# Patient Record
Sex: Male | Born: 1970 | Race: Black or African American | Hispanic: No | Marital: Single | Smoking: Current some day smoker
Health system: Southern US, Community
[De-identification: ages and names within clinical notes are randomized; demographics above are authoritative.]

## PROBLEM LIST (undated history)

## (undated) ENCOUNTER — Ambulatory Visit (HOSPITAL_COMMUNITY): Source: Home / Self Care

## (undated) HISTORY — PX: CERVICAL FUSION: SHX112

---

## 2001-08-22 ENCOUNTER — Encounter: Payer: Self-pay | Admitting: Emergency Medicine

## 2001-08-22 ENCOUNTER — Emergency Department (HOSPITAL_COMMUNITY): Admission: EM | Admit: 2001-08-22 | Discharge: 2001-08-22 | Payer: Self-pay | Admitting: Emergency Medicine

## 2005-02-10 ENCOUNTER — Emergency Department (HOSPITAL_COMMUNITY): Admission: EM | Admit: 2005-02-10 | Discharge: 2005-02-10 | Payer: Self-pay | Admitting: Emergency Medicine

## 2006-05-22 ENCOUNTER — Emergency Department (HOSPITAL_COMMUNITY): Admission: EM | Admit: 2006-05-22 | Discharge: 2006-05-23 | Payer: Self-pay | Admitting: Emergency Medicine

## 2007-09-24 ENCOUNTER — Emergency Department (HOSPITAL_COMMUNITY): Admission: EM | Admit: 2007-09-24 | Discharge: 2007-09-24 | Payer: Self-pay | Admitting: Emergency Medicine

## 2007-11-04 ENCOUNTER — Emergency Department (HOSPITAL_COMMUNITY): Admission: EM | Admit: 2007-11-04 | Discharge: 2007-11-04 | Payer: Self-pay | Admitting: Emergency Medicine

## 2014-03-25 ENCOUNTER — Encounter (HOSPITAL_COMMUNITY): Payer: Self-pay | Admitting: *Deleted

## 2014-03-25 ENCOUNTER — Emergency Department (HOSPITAL_COMMUNITY)
Admission: EM | Admit: 2014-03-25 | Discharge: 2014-03-25 | Disposition: A | Discharge status: Home / Self Care | Payer: Self-pay | Attending: Emergency Medicine | Admitting: Emergency Medicine

## 2014-03-25 DIAGNOSIS — K529 Noninfective gastroenteritis and colitis, unspecified: Secondary | ICD-10-CM | POA: Insufficient documentation

## 2014-03-25 DIAGNOSIS — Z720 Tobacco use: Secondary | ICD-10-CM | POA: Insufficient documentation

## 2014-03-25 MED ORDER — DIPHENOXYLATE-ATROPINE 2.5-0.025 MG PO TABS: 1.0000 | ORAL_TABLET | Freq: Four times a day (QID) | ORAL | Status: DC | PRN

## 2014-03-25 MED ORDER — MORPHINE SULFATE 4 MG/ML IJ SOLN
4.0000 mg | Freq: Once | INTRAMUSCULAR | Status: AC
  Administered 2014-03-25: 4 mg via INTRAVENOUS
  Filled 2014-03-25: qty 1

## 2014-03-25 MED ORDER — SODIUM CHLORIDE 0.9 % IV BOLUS (SEPSIS)
1000.0000 mL | Freq: Once | INTRAVENOUS | Status: AC
  Administered 2014-03-25: 1000 mL via INTRAVENOUS

## 2014-03-25 MED ORDER — ONDANSETRON HCL 4 MG/2ML IJ SOLN
4.0000 mg | Freq: Once | INTRAMUSCULAR | Status: AC
  Administered 2014-03-25: 4 mg via INTRAVENOUS
  Filled 2014-03-25: qty 2

## 2014-03-25 MED ORDER — PROMETHAZINE HCL 25 MG PO TABS: 25.0000 mg | ORAL_TABLET | Freq: Four times a day (QID) | ORAL | Status: DC | PRN

## 2014-03-25 NOTE — ED Notes (Signed)
Pt arrives from home via POV. Pt reports he began having constant nausea with v/d when he woke up for work this morning. Pt reports he has had multiple episodes of both vomiting and diarrhea this morning. Pt denies pain, but reports he feels a little light headed.

## 2014-03-25 NOTE — ED Provider Notes (Signed)
CSN: [id number]     Arrival date & time 03/25/14  1015 History   First MD Initiated Contact with Patient 03/25/14 1033     Chief Complaint  Patient presents with  . Vomiting     (Consider location/radiation/quality/duration/timing/severity/associated sxs/prior Treatment) HPI..... Multiple episodes of vomiting and diarrhea this morning. No blood in emesis or stool. Also complains of slight headache. He is normally healthy. He has not been exposed to any pathogens. Severity is mild to moderate. Nothing makes symptoms better or worse. No fever, sweats, chills, abdominal pain  History reviewed. No pertinent past medical history. Past Surgical History  Procedure Laterality Date  . Cervical fusion     No family history on file. History  Substance Use Topics  . Smoking status: Current Every Day Smoker  . Smokeless tobacco: Never Used  . Alcohol Use: Yes     Comment: 40 oz every day    Review of Systems  All other systems reviewed and are negative.     Allergies  Review of patient's allergies indicates no known allergies.  Home Medications   Prior to Admission medications   Medication Sig Start Date End Date Taking? Authorizing Provider  diphenoxylate-atropine (LOMOTIL) 2.5-0.025 MG per tablet Take 1 tablet by mouth 4 (four) times daily as needed for diarrhea or loose stools. 03/25/14   Donnetta HutchingBrian Synethia Endicott, MD  promethazine (PHENERGAN) 25 MG tablet Take 1 tablet (25 mg total) by mouth every 6 (six) hours as needed for nausea. 03/25/14   Donnetta HutchingBrian Alanda Colton, MD   BP 126/68 mmHg  Pulse 63  Temp(Src) 98 F (36.7 C) (Oral)  Resp 16  Ht 5\' 11"  (1.803 m)  Wt 175 lb (79.379 kg)  BMI 24.42 kg/m2  SpO2 98% Physical Exam  Constitutional: He is oriented to person, place, and time. He appears well-developed and well-nourished.  HENT:  Head: Normocephalic and atraumatic.  Eyes: Conjunctivae and EOM are normal. Pupils are equal, round, and reactive to light.  Neck: Normal range of motion. Neck supple.   Cardiovascular: Normal rate and regular rhythm.   Pulmonary/Chest: Effort normal and breath sounds normal.  Abdominal: Soft. Bowel sounds are normal.  Musculoskeletal: Normal range of motion.  Neurological: He is alert and oriented to person, place, and time.  Skin: Skin is warm and dry.  Psychiatric: He has a normal mood and affect. His behavior is normal.  Nursing note and vitals reviewed.   ED Course  Procedures (including critical care time) Labs Review Labs Reviewed - No data to display  Imaging Review No results found.   EKG Interpretation None      MDM   Final diagnoses:  Gastroenteritis    No acute abdomen. Patient feels better after 2 L of IV fluids, IV Zofran, IV morphine. Discharge medications Phenergan 25 mg and Lomotil. No clinical evidence of appendicitis at discharge    Donnetta HutchingBrian Tiarrah Saville, MD 03/25/14 1432

## 2014-03-25 NOTE — Discharge Instructions (Signed)
Increase fluids. Medication for nausea and diarrhea. Rest.

## 2015-12-14 DIAGNOSIS — R0602 Shortness of breath: Secondary | ICD-10-CM | POA: Insufficient documentation

## 2015-12-14 DIAGNOSIS — Z5321 Procedure and treatment not carried out due to patient leaving prior to being seen by health care provider: Secondary | ICD-10-CM | POA: Insufficient documentation

## 2015-12-14 DIAGNOSIS — F17200 Nicotine dependence, unspecified, uncomplicated: Secondary | ICD-10-CM | POA: Insufficient documentation

## 2015-12-14 NOTE — ED Triage Notes (Signed)
Patient presents with c/o feeling SOB.  No acute distress noted

## 2015-12-15 ENCOUNTER — Emergency Department (HOSPITAL_COMMUNITY)
Admission: EM | Admit: 2015-12-15 | Discharge: 2015-12-15 | Disposition: A | Discharge status: Home / Self Care | Payer: Self-pay | Attending: Emergency Medicine | Admitting: Emergency Medicine

## 2015-12-15 ENCOUNTER — Encounter (HOSPITAL_COMMUNITY): Payer: Self-pay | Admitting: *Deleted

## 2015-12-15 ENCOUNTER — Emergency Department (HOSPITAL_COMMUNITY): Payer: Self-pay

## 2015-12-15 LAB — CBC WITH DIFFERENTIAL/PLATELET
BASOS PCT: 0 %
Basophils Absolute: 0 10*3/uL (ref 0.0–0.1)
Eosinophils Absolute: 0.2 10*3/uL (ref 0.0–0.7)
Eosinophils Relative: 3 %
HEMATOCRIT: 44.7 % (ref 39.0–52.0)
HEMOGLOBIN: 15.7 g/dL (ref 13.0–17.0)
LYMPHS ABS: 2.9 10*3/uL (ref 0.7–4.0)
LYMPHS PCT: 48 %
MCH: 32.2 pg (ref 26.0–34.0)
MCHC: 35.1 g/dL (ref 30.0–36.0)
MCV: 91.6 fL (ref 78.0–100.0)
MONO ABS: 0.5 10*3/uL (ref 0.1–1.0)
MONOS PCT: 8 %
NEUTROS ABS: 2.5 10*3/uL (ref 1.7–7.7)
NEUTROS PCT: 41 %
Platelets: 281 10*3/uL (ref 150–400)
RBC: 4.88 MIL/uL (ref 4.22–5.81)
RDW: 12.6 % (ref 11.5–15.5)
WBC: 6.1 10*3/uL (ref 4.0–10.5)

## 2015-12-15 LAB — COMPREHENSIVE METABOLIC PANEL
ALBUMIN: 4.3 g/dL (ref 3.5–5.0)
ALK PHOS: 66 U/L (ref 38–126)
ALT: 18 U/L (ref 17–63)
ANION GAP: 7 (ref 5–15)
AST: 27 U/L (ref 15–41)
BILIRUBIN TOTAL: 1.9 mg/dL — AB (ref 0.3–1.2)
BUN: 8 mg/dL (ref 6–20)
CALCIUM: 9.6 mg/dL (ref 8.9–10.3)
CO2: 29 mmol/L (ref 22–32)
Chloride: 105 mmol/L (ref 101–111)
Creatinine, Ser: 0.99 mg/dL (ref 0.61–1.24)
GFR calc Af Amer: >60 mL/min (ref >60)
GLUCOSE: 111 mg/dL — AB (ref 65–99)
POTASSIUM: 3.4 mmol/L — AB (ref 3.5–5.1)
Sodium: 141 mmol/L (ref 135–145)
TOTAL PROTEIN: 7.6 g/dL (ref 6.5–8.1)

## 2015-12-15 LAB — I-STAT TROPONIN, ED: TROPONIN I, POC: 0 ng/mL (ref 0.00–0.08)

## 2015-12-15 NOTE — ED Notes (Signed)
Called Pt for reassessment, no response. Pt is not seen in the lobby

## 2015-12-15 NOTE — ED Notes (Signed)
Pt called for a room. No response. Pt is not seen in the lobby

## 2016-05-23 ENCOUNTER — Encounter (HOSPITAL_COMMUNITY): Payer: Self-pay | Admitting: Emergency Medicine

## 2016-05-23 ENCOUNTER — Emergency Department (HOSPITAL_COMMUNITY)
Admission: EM | Admit: 2016-05-23 | Discharge: 2016-05-23 | Disposition: A | Discharge status: Home / Self Care | Payer: Self-pay | Attending: Emergency Medicine | Admitting: Emergency Medicine

## 2016-05-23 DIAGNOSIS — F17200 Nicotine dependence, unspecified, uncomplicated: Secondary | ICD-10-CM | POA: Insufficient documentation

## 2016-05-23 DIAGNOSIS — Y829 Unspecified medical devices associated with adverse incidents: Secondary | ICD-10-CM | POA: Insufficient documentation

## 2016-05-23 DIAGNOSIS — K0889 Other specified disorders of teeth and supporting structures: Secondary | ICD-10-CM | POA: Insufficient documentation

## 2016-05-23 DIAGNOSIS — T886XXA Anaphylactic reaction due to adverse effect of correct drug or medicament properly administered, initial encounter: Secondary | ICD-10-CM | POA: Insufficient documentation

## 2016-05-23 DIAGNOSIS — T887XXA Unspecified adverse effect of drug or medicament, initial encounter: Secondary | ICD-10-CM | POA: Insufficient documentation

## 2016-05-23 DIAGNOSIS — Z79899 Other long term (current) drug therapy: Secondary | ICD-10-CM | POA: Insufficient documentation

## 2016-05-23 MED ORDER — PENICILLIN V POTASSIUM 500 MG PO TABS: 500.0000 mg | ORAL_TABLET | Freq: Four times a day (QID) | ORAL | 0 refills | Status: DC

## 2016-05-23 MED ORDER — NAPROXEN 250 MG PO TABS: 250.0000 mg | ORAL_TABLET | Freq: Two times a day (BID) | ORAL | 0 refills | Status: DC

## 2016-05-23 NOTE — ED Triage Notes (Signed)
Pt presents to ED after taking his first dose of penicillin tonight for a tooth infection.  Patient states he took it with a sandwich and then began to vomit shortly after.  Patient states he vomited approximately 5 times in 1 hour.  Patient now c/o sore throat, shortness of breath, and "a drowning feeling".  Patient's airway patent.  SpO2 100% on RA.

## 2016-05-23 NOTE — ED Triage Notes (Signed)
Pt c/o tooth pain in upper L side of jaw, no swelling noted, no fevers. Pt does not see a dentist.

## 2016-05-23 NOTE — ED Provider Notes (Signed)
[facility]EMERGENCY DEPT Provider Note   CSN: [id number] Arrival date & time: 05/23/16  0720     History   Chief Complaint Chief Complaint  Patient presents with  . Dental Pain    HPI Ralph Daugherty is a 46 y.o. male.  Ralph Daugherty is a 46 y.o. Male who presents to the emergency department complaining of left lower dental pain for the past 2 days. Patient reports his dental pain began 2 days ago. He reports a cracked tooth to this area. He has taken nothing for treatment of his symptoms. He does not have a dentist. He denies fevers, sore throat, trouble swallowing, facial swelling, or rashes.   The history is provided by the patient and medical records. No language interpreter was used.  Dental Pain      History reviewed. No pertinent past medical history.  There are no active problems to display for this patient.   Past Surgical History:  Procedure Laterality Date  . CERVICAL FUSION         Home Medications    Prior to Admission medications   Medication Sig Start Date End Date Taking? Authorizing Provider  diphenoxylate-atropine (LOMOTIL) 2.5-0.025 MG per tablet Take 1 tablet by mouth 4 (four) times daily as needed for diarrhea or loose stools. 03/25/14   Donnetta Hutching, MD  naproxen (NAPROSYN) 250 MG tablet Take 1 tablet (250 mg total) by mouth 2 (two) times daily with a meal. 05/23/16   Everlene Farrier, PA-C  penicillin v potassium (VEETID) 500 MG tablet Take 1 tablet (500 mg total) by mouth 4 (four) times daily. 05/23/16   Everlene Farrier, PA-C  promethazine (PHENERGAN) 25 MG tablet Take 1 tablet (25 mg total) by mouth every 6 (six) hours as needed for nausea. 03/25/14   Donnetta Hutching, MD    Family History No family history on file.  Social History Social History  Substance Use Topics  . Smoking status: Current Every Day Smoker  . Smokeless tobacco: Never Used  . Alcohol use Yes     Comment: 40 oz every day     Allergies   Patient has no known  allergies.   Review of Systems Review of Systems  Constitutional: Negative for chills and fever.  HENT: Positive for dental problem. Negative for drooling, ear pain, facial swelling, sore throat and trouble swallowing.   Eyes: Negative for pain and visual disturbance.  Musculoskeletal: Negative for neck pain.  Skin: Negative for rash.  Neurological: Negative for headaches.     Physical Exam Updated Vital Signs BP (!) 145/102 (BP Location: Right Arm)   Pulse 61   Temp 97.7 F (36.5 C) (Oral)   Resp 16   Ht  (1.803 m)   Wt 77.1 kg   SpO2 99%   BMI 23.71 kg/m   Physical Exam  Constitutional: He appears well-developed and well-nourished. No distress.  Non-toxic appearing.   HENT:  Head: Normocephalic and atraumatic.  Right Ear: External ear normal.  Left Ear: External ear normal.  Mouth/Throat: Oropharynx is clear and moist. No oropharyngeal exudate.  Tenderness to left lower molars. Dental caries present.  No discharge from the mouth. No facial swelling.  Uvula is midline without edema. Soft palate rises symmetrically. No tonsillar hypertrophy or exudates. Tongue protrusion is normal.  No trismus.    Eyes: Conjunctivae and EOM are normal. Pupils are equal, round, and reactive to light. Right eye exhibits no discharge. Left eye exhibits no discharge.  Neck: Normal range of motion. Neck  supple. No JVD present. No tracheal deviation present.  Cardiovascular: Normal rate and intact distal pulses.   Pulmonary/Chest: Effort normal. No stridor. No respiratory distress.  Lymphadenopathy:    He has no cervical adenopathy.  Neurological: Coordination normal.  Skin: Skin is warm and dry. Capillary refill takes less than 2 seconds. No rash noted. He is not diaphoretic. No erythema. No pallor.  Psychiatric: He has a normal mood and affect. His behavior is normal.  Nursing note and vitals reviewed.    ED Treatments / Results  Labs (all labs ordered are listed, but only  abnormal results are displayed) Labs Reviewed - No data to display  EKG  EKG Interpretation None       Radiology No results found.  Procedures Procedures (including critical care time)  Medications Ordered in ED Medications - No data to display   Initial Impression / Assessment and Plan / ED Course  I have reviewed the triage vital signs and the nursing notes.  Pertinent labs & imaging results that were available during my care of the patient were reviewed by me and considered in my medical decision making (see chart for details).    This  is a 46 y.o. Male who presents to the emergency department complaining of left lower dental pain for the past 2 days. Patient with toothache.  No gross abscess.  Exam unconcerning for Ludwig's angina or spread of infection.  Will treat with penicillin and pain medicine.  Urged patient to follow-up with dentist Dr. Barbette Merino. I advised the patient to follow-up with their primary care provider this week. I advised the patient to return to the emergency department with new or worsening symptoms or new concerns. The patient verbalized understanding and agreement with plan.     Final Clinical Impressions(s) / ED Diagnoses   Final diagnoses:  Pain, dental    New Prescriptions Discharge Medication List as of 05/23/2016  8:16 AM    START taking these medications   Details  naproxen (NAPROSYN) 250 MG tablet Take 1 tablet (250 mg total) by mouth 2 (two) times daily with a meal., Starting Wed 05/23/2016, Print    penicillin v potassium (VEETID) 500 MG tablet Take 1 tablet (500 mg total) by mouth 4 (four) times daily., Starting Wed 05/23/2016, Print         Everlene Farrier, PA-C 05/23/16 4098    Laurence Spates, MD 05/29/16 [phone]

## 2016-05-24 ENCOUNTER — Emergency Department (HOSPITAL_COMMUNITY)
Admission: EM | Admit: 2016-05-24 | Discharge: 2016-05-24 | Disposition: A | Discharge status: Home / Self Care | Payer: Self-pay | Attending: Emergency Medicine | Admitting: Emergency Medicine

## 2016-05-24 DIAGNOSIS — T782XXA Anaphylactic shock, unspecified, initial encounter: Secondary | ICD-10-CM

## 2016-05-24 MED ORDER — EPINEPHRINE 0.3 MG/0.3ML IJ SOAJ: 0.3000 mg | Freq: Once | INTRAMUSCULAR | 1 refills | Status: AC

## 2016-05-24 MED ORDER — CLINDAMYCIN HCL 150 MG PO CAPS: 300.0000 mg | ORAL_CAPSULE | Freq: Four times a day (QID) | ORAL | 0 refills | Status: DC

## 2016-05-24 MED ORDER — FAMOTIDINE 20 MG PO TABS
20.0000 mg | ORAL_TABLET | Freq: Once | ORAL | Status: AC
  Administered 2016-05-24: 20 mg via ORAL
  Filled 2016-05-24: qty 1

## 2016-05-24 MED ORDER — EPINEPHRINE 0.3 MG/0.3ML IJ SOAJ
0.3000 mg | Freq: Once | INTRAMUSCULAR | Status: AC
  Administered 2016-05-24: 0.3 mg via INTRAMUSCULAR
  Filled 2016-05-24: qty 0.3

## 2016-05-24 MED ORDER — PREDNISONE 20 MG PO TABS
60.0000 mg | ORAL_TABLET | Freq: Once | ORAL | Status: AC
  Administered 2016-05-24: 60 mg via ORAL
  Filled 2016-05-24: qty 3

## 2016-05-24 MED ORDER — PREDNISONE 20 MG PO TABS: 40.0000 mg | ORAL_TABLET | Freq: Every day | ORAL | 0 refills | Status: DC

## 2016-05-24 MED ORDER — DIPHENHYDRAMINE HCL 25 MG PO TABS: 25.0000 mg | ORAL_TABLET | Freq: Four times a day (QID) | ORAL | 0 refills | Status: DC

## 2016-05-24 MED ORDER — DIPHENHYDRAMINE HCL 25 MG PO CAPS
50.0000 mg | ORAL_CAPSULE | Freq: Once | ORAL | Status: AC
  Administered 2016-05-24: 50 mg via ORAL
  Filled 2016-05-24: qty 2

## 2016-05-24 NOTE — Discharge Instructions (Signed)
Please read and follow all provided instructions.  Your diagnoses today include:  1. Anaphylaxis, initial encounter     Tests performed today include:  Vital signs. See below for your results today.   Medications prescribed:   Epi-pen  Inject into thigh as directed if you have a severe reaction that causes throat swelling or any trouble breathing. Call 9-1-1 immediately if you use an Epi-pen. You should be evaluated at a hospital as soon as possible.     Prednisone - steroid medicine   It is best to take this medication in the morning to prevent sleeping problems. If you are diabetic, monitor your blood sugar closely and stop taking Prednisone if blood sugar is over 300. Take with food to prevent stomach upset.    Benadryl (diphenhydramine) - antihistamine  You can find this medication over-the-counter.   DO NOT exceed:    Benadryl every 6 hours    Benadryl will make you drowsy. DO NOT drive or perform any activities that require you to be awake and alert if taking this.   Clindamycin - antibiotic  You have been prescribed an antibiotic medicine: take the entire course of medicine even if you are feeling better. Stopping early can cause the antibiotic not to work.  Take any prescribed medications only as directed.  Home care instructions:   Follow any educational materials contained in this packet  Follow-up instructions: Please follow-up with your primary care provider in the next 3 days for further evaluation of your symptoms.   Return instructions:   Please return to the Emergency Department if you experience worsening symptoms.   Call 9-1-1 immediately if you have an allergic reaction that involves your lips, mouth, throat or if you have any difficulty breathing. This is a life-threatening emergency.   Please return if you have any other emergent concerns.  Additional Information:  Your vital signs today were: BP (!) 121/92    Pulse 68    Temp 97.1 F  (36.2 C) (Oral)    Resp 13    SpO2 93%  If your blood pressure (BP) was elevated above 135/85 this visit, please have this repeated by your doctor within one month. --------------

## 2016-05-24 NOTE — ED Provider Notes (Signed)
[facility] Provider Note   CSN: [id number] Arrival date & time: 05/23/16  2337     History   Chief Complaint Chief Complaint  Patient presents with  . Emesis  . Allergic Reaction    HPI Ralph Daugherty is a 46 y.o. male.  Patient withliving past allergic history presents with complaint of vomiting, throat tightness, sore throat, shortness of breath, voice change after taking naproxen and penicillin tonight. Patient was seen previously in emergency department today and treated for a dental infection. Patient stated he vomited about 5 times. No diarrhea. No lightheadedness or syncope. No chest pains. No neck pain or difficulty with motion. No skin rash or hives. The onset of this condition was acute. The course is constant. Aggravating factors: none. Alleviating factors: none.        History reviewed. No pertinent past medical history.  There are no active problems to display for this patient.   Past Surgical History:  Procedure Laterality Date  . CERVICAL FUSION         Home Medications    Prior to Admission medications   Medication Sig Start Date End Date Taking? Authorizing Provider  diphenoxylate-atropine (LOMOTIL) 2.5-0.025 MG per tablet Take 1 tablet by mouth 4 (four) times daily as needed for diarrhea or loose stools. 03/25/14   Donnetta Hutching, MD  naproxen (NAPROSYN) 250 MG tablet Take 1 tablet (250 mg total) by mouth 2 (two) times daily with a meal. 05/23/16   Everlene Farrier, PA-C  penicillin v potassium (VEETID) 500 MG tablet Take 1 tablet (500 mg total) by mouth 4 (four) times daily. 05/23/16   Everlene Farrier, PA-C  promethazine (PHENERGAN) 25 MG tablet Take 1 tablet (25 mg total) by mouth every 6 (six) hours as needed for nausea. 03/25/14   Donnetta Hutching, MD    Family History History reviewed. No pertinent family history.  Social History Social History  Substance Use Topics  . Smoking status: Current Every Day Smoker  . Smokeless tobacco: Never Used   . Alcohol use Yes     Comment: 40 oz every day     Allergies   Patient has no known allergies.   Review of Systems Review of Systems  Constitutional: Negative for fever.  HENT: Positive for voice change. Negative for facial swelling and trouble swallowing.   Eyes: Negative for redness.  Respiratory: Negative for shortness of breath, wheezing and stridor.   Cardiovascular: Negative for chest pain.  Gastrointestinal: Positive for nausea and vomiting.  Musculoskeletal: Negative for myalgias.  Skin: Negative for rash.  Neurological: Negative for light-headedness.  Psychiatric/Behavioral: Negative for confusion.     Physical Exam Updated Vital Signs BP (!) 135/94 (BP Location: Left Arm)   Pulse 94   Temp 97.1 F (36.2 C) (Oral)   Resp 20   SpO2 100%   Physical Exam  Constitutional: He appears well-developed and well-nourished.  HENT:  Head: Normocephalic and atraumatic.  Nose: Nose normal.  Mouth/Throat: Uvula is midline and mucous membranes are normal. No trismus in the jaw. Uvula swelling present. No oropharyngeal exudate, posterior oropharyngeal erythema or tonsillar abscesses.  Uvula is edematous. Tongue and lips normal.   Eyes: Conjunctivae are normal. Right eye exhibits no discharge. Left eye exhibits no discharge.  Neck: Normal range of motion. Neck supple.  Full range of motion of neck without pain.  Cardiovascular: Normal rate, regular rhythm and normal heart sounds.   No murmur heard. Pulmonary/Chest: Effort normal and breath sounds normal.  Abdominal: Soft. There is  no tenderness.  Neurological: He is alert.  Skin: Skin is warm and dry.  No skin rash or hives.  Psychiatric: He has a normal mood and affect.  Nursing note and vitals reviewed.    ED Treatments / Results   Procedures Procedures (including critical care time)  Medications Ordered in ED Medications  EPINEPHrine (EPI-PEN) injection 0.3 mg (not administered)  predniSONE (DELTASONE)  tablet 60 mg (60 mg Oral Given 05/24/16 0118)  famotidine (PEPCID) tablet 20 mg (20 mg Oral Given 05/24/16 0117)  diphenhydrAMINE (BENADRYL) capsule 50 mg (50 mg Oral Given 05/24/16 0118)     Initial Impression / Assessment and Plan / ED Course  I have reviewed the triage vital signs and the nursing notes.  Pertinent labs & imaging results that were available during my care of the patient were reviewed by me and considered in my medical decision making (see chart for details).     Patient seen and examined. Work-up initiated. Medications ordered.   Vital signs reviewed and are as follows: BP (!) 135/94 (BP Location: Left Arm)   Pulse 94   Temp 97.1 F (36.2 C) (Oral)   Resp 20   SpO2 100%   Patient had uvula edema. No tongue swelling. Given reported voice change, will treat with epinephrine and monitor.   2:18 AM Pt re-evaluated. Subjectively he is feeling better. Uvula looks about the same.   5:00 AM Pt clinically improved. Uvula edema resolving. Subjectively the patient feels much better.  Will discharge to home at this time with EpiPen, prednisone. Patient counseled on use of EpiPen. Patient counseled to avoid penicillin. Will switch to clindamycin. Encouraged Tylenol for pain control. This will also cover infectious uvulitis.   Patient encouraged to return with any worsening symptoms or other concerns. He verbalizes understanding and agrees with plan.   Final Clinical Impressions(s) / ED Diagnoses   Final diagnoses:  Anaphylaxis, initial encounter   Patient with suspected anaphylactic reaction, likely to penicillin. Cannot rule out naproxen. Treatment as above. Patient clinically improved in emergency department. At no point did patient have airway obstruction. He appears much better at time of discharge. Lower suspicion for deep space infection in neck. Patient with excellent range of motion of the neck without pain.  New Prescriptions Discharge Medication List as of  05/24/2016  5:07 AM    START taking these medications   Details  clindamycin (CLEOCIN) 150 MG capsule Take 2 capsules (300 mg total) by mouth every 6 (six) hours., Starting Thu 05/24/2016, Print    diphenhydrAMINE (BENADRYL) 25 MG tablet Take 1 tablet (25 mg total) by mouth every 6 (six) hours., Starting Thu 05/24/2016, Print    EPINEPHrine 0.3 mg/0.3 mL IJ SOAJ injection Inject 0.3 mLs (0.3 mg total) into the muscle once., Starting Thu 05/24/2016, Print    predniSONE (DELTASONE) 20 MG tablet Take 2 tablets (40 mg total) by mouth daily., Starting Thu 05/24/2016, Print         [place], PA-C 05/24/16 1610    Geoffery Lyons, MD 05/24/16 [phone]

## 2016-05-24 NOTE — ED Notes (Signed)
Pt departed in NAD, refused use of wheelchair.  

## 2016-06-11 ENCOUNTER — Emergency Department (HOSPITAL_COMMUNITY)
Admission: EM | Admit: 2016-06-11 | Discharge: 2016-06-11 | Disposition: A | Discharge status: Home / Self Care | Payer: Self-pay | Attending: Emergency Medicine | Admitting: Emergency Medicine

## 2016-06-11 ENCOUNTER — Encounter (HOSPITAL_COMMUNITY): Payer: Self-pay | Admitting: Emergency Medicine

## 2016-06-11 DIAGNOSIS — Z87891 Personal history of nicotine dependence: Secondary | ICD-10-CM | POA: Insufficient documentation

## 2016-06-11 DIAGNOSIS — K0889 Other specified disorders of teeth and supporting structures: Secondary | ICD-10-CM | POA: Insufficient documentation

## 2016-06-11 DIAGNOSIS — R030 Elevated blood-pressure reading, without diagnosis of hypertension: Secondary | ICD-10-CM | POA: Insufficient documentation

## 2016-06-11 MED ORDER — CLINDAMYCIN HCL 150 MG PO CAPS: 300.0000 mg | ORAL_CAPSULE | Freq: Four times a day (QID) | ORAL | 0 refills | Status: DC

## 2016-06-11 MED ORDER — OXYCODONE-ACETAMINOPHEN 5-325 MG PO TABS
1.0000 | ORAL_TABLET | Freq: Once | ORAL | Status: AC
  Administered 2016-06-11: 1 via ORAL
  Filled 2016-06-11: qty 1

## 2016-06-11 NOTE — Discharge Instructions (Signed)
You have a dental injury/infection. It is very important that you get evaluated by a dentist as soon as possible. Call tomorrow to schedule an appointment. Ibuprofen as needed for pain. Take your full course of antibiotics. Read the instructions below. Your blood pressure was elevated while in the emergency Department today. Please see your regular doctor next week for a blood pressure recheck.  Eat a soft or liquid diet and rinse your mouth out after meals with warm water. You should see a dentist or return here at once if you have increased swelling, increased pain or uncontrolled bleeding from the site of your injury.  SEEK MEDICAL CARE IF:  You have increased pain not controlled with medicines.  You have swelling around your tooth, in your face or neck.  You have bleeding which starts, continues, or gets worse.  You have a fever >101 If you are unable to open your mouth

## 2016-06-11 NOTE — ED Provider Notes (Signed)
[facility]-EMERGENCY DEPT Provider Note   CSN: [id number] Arrival date & time: 06/11/16  2019  By signing my name below, I, Doreatha Martin and Alexia Perkins-Maupin, attest that this documentation has been prepared under the direction and in the presence of  [facility], PA-C. Electronically Signed: Georgeanna Lea, ED Scribe. 06/11/16. 10:24 PM.   History   Chief Complaint Chief Complaint  Patient presents with  . Dental Pain   HPI Ralph Daugherty is a 46 y.o. male presents to the Emergency Department complaining of throbbing left lower dental pain that began 4 days ago with associated left-sided facial swelling. Pt was seen in the ED on 05/23/16 for the same pain and reports it resolved temporarily after finishing the course of antibiotics prescribed. He states he has not taken any medication for the pain, but tried salt water gargles with no relief. He denies fever, difficulty swallowing or tolerating secretions.   The history is provided by the patient. No language interpreter was used.    History reviewed. No pertinent past medical history.  There are no active problems to display for this patient.   Past Surgical History:  Procedure Laterality Date  . CERVICAL FUSION         Home Medications    Prior to Admission medications   Medication Sig Start Date End Date Taking? Authorizing Provider  clindamycin (CLEOCIN) 150 MG capsule Take 2 capsules (300 mg total) by mouth every 6 (six) hours. 06/11/16   Georgianna Band, Chase Picket, PA-C  diphenhydrAMINE (BENADRYL) 25 MG tablet Take 1 tablet (25 mg total) by mouth every 6 (six) hours. 05/24/16   Renne Crigler, PA-C  diphenoxylate-atropine (LOMOTIL) 2.5-0.025 MG per tablet Take 1 tablet by mouth 4 (four) times daily as needed for diarrhea or loose stools. 03/25/14   Donnetta Hutching, MD  naproxen (NAPROSYN) 250 MG tablet Take 1 tablet (250 mg total) by mouth 2 (two) times daily with a meal. 05/23/16   Everlene Farrier, PA-C    predniSONE (DELTASONE) 20 MG tablet Take 2 tablets (40 mg total) by mouth daily. 05/24/16   Renne Crigler, PA-C  promethazine (PHENERGAN) 25 MG tablet Take 1 tablet (25 mg total) by mouth every 6 (six) hours as needed for nausea. 03/25/14   Donnetta Hutching, MD    Family History No family history on file.  Social History Social History  Substance Use Topics  . Smoking status: Former Smoker    Quit date: 1998  . Smokeless tobacco: Never Used  . Alcohol use Yes     Comment: 40 oz every day     Allergies   Penicillins   Review of Systems Review of Systems  Constitutional: Negative for fever.  HENT: Positive for dental problem and facial swelling. Negative for trouble swallowing.     Physical Exam Updated Vital Signs BP (!) 154/99   Pulse 61   Temp 98.9 F (37.2 C)   Resp 18   Ht 5\' 11"  (1.803 m)   Wt 81.6 kg   SpO2 100%   BMI 25.10 kg/m   Physical Exam  Constitutional: He appears well-developed and well-nourished. No distress.  HENT:  Head: Normocephalic and atraumatic.  Mouth/Throat:    Dental cavities and poor oral dentition noted. Pain along tooth as depicted in image. No abscess noted. Midline uvula. No trismus. OP moist and clear. No oropharyngeal erythema or edema. Neck supple with no tenderness. No facial edema.   Neck: Neck supple.  Cardiovascular: Normal rate, regular rhythm and normal  heart sounds.   No murmur heard. Pulmonary/Chest: Effort normal and breath sounds normal. No respiratory distress. He has no wheezes. He has no rales.  Musculoskeletal: Normal range of motion.  Neurological: He is alert.  Skin: Skin is warm and dry.  Nursing note and vitals reviewed.    ED Treatments / Results  DIAGNOSTIC STUDIES: Oxygen Saturation is 98% on RA, normal by my interpretation.   COORDINATION OF CARE: 10:22 PM-Discussed next steps with pt. Pt verbalized understanding and is agreeable with the plan. Will rx antibiotics.   Labs (all labs ordered are  listed, but only abnormal results are displayed) Labs Reviewed - No data to display  EKG  EKG Interpretation None       Radiology No results found.  Procedures Procedures (including critical care time)  Medications Ordered in ED Medications  oxyCODONE-acetaminophen (PERCOCET/ROXICET) 5-325 MG per tablet 1 tablet (1 tablet Oral Given 06/11/16 2331)     Initial Impression / Assessment and Plan / ED Course  I have reviewed the triage vital signs and the nursing notes.  Pertinent labs & imaging results that were available during my care of the patient were reviewed by me and considered in my medical decision making (see chart for details).    Ralph Daugherty is a 46 y.o. male who presents to ED for dental pain. No abscess requiring immediate incision and drainage. Patient is afebrile, non toxic appearing, and swallowing secretions well. Exam not concerning for Ludwig's angina or pharyngeal abscess. Will treat with Clinda. I provided dental resource guide and stressed the importance of dental follow up for ultimate management of dental pain. Patient voices understanding and is agreeable to plan.  The patient was noted to have elevated BP in ED today. I have spoken with the patient regarding elevated blood pressure reading. I instructed the patient to follow up with their PCP within 1 week for BP check.   Final Clinical Impressions(s) / ED Diagnoses   Final diagnoses:  Elevated blood pressure reading  Pain, dental    New Prescriptions Discharge Medication List as of 06/11/2016 11:12 PM     I personally performed the services described in this documentation, which was scribed in my presence. The recorded information has been reviewed and is accurate.    Jannell Franta, Chase PicketJaime Pilcher, PA-C 06/12/16 [id number]    Linwood DibblesKnapp, Jon, MD 06/13/16 [phone]

## 2016-06-11 NOTE — ED Triage Notes (Signed)
Pt c/o L dental and jaw pain - upper and lower x 4 days now. Pt states he was referred to a specialist last time (seen 2 days ago for same) but he couldn't make it work because of his job schedule. Pt denies fevers/chills/difficulty swallowing, just worsened pain.

## 2016-11-24 ENCOUNTER — Emergency Department (HOSPITAL_COMMUNITY)
Admission: EM | Admit: 2016-11-24 | Discharge: 2016-11-24 | Disposition: A | Discharge status: Home / Self Care | Payer: Self-pay | Attending: Emergency Medicine | Admitting: Emergency Medicine

## 2016-11-24 ENCOUNTER — Emergency Department (HOSPITAL_COMMUNITY): Payer: Self-pay

## 2016-11-24 ENCOUNTER — Encounter (HOSPITAL_COMMUNITY): Payer: Self-pay | Admitting: *Deleted

## 2016-11-24 DIAGNOSIS — Z87891 Personal history of nicotine dependence: Secondary | ICD-10-CM | POA: Insufficient documentation

## 2016-11-24 DIAGNOSIS — R0789 Other chest pain: Secondary | ICD-10-CM | POA: Insufficient documentation

## 2016-11-24 LAB — BASIC METABOLIC PANEL
Anion gap: 6 (ref 5–15)
BUN: 14 mg/dL (ref 6–20)
CHLORIDE: 104 mmol/L (ref 101–111)
CO2: 27 mmol/L (ref 22–32)
CREATININE: 0.89 mg/dL (ref 0.61–1.24)
Calcium: 9.4 mg/dL (ref 8.9–10.3)
GFR calc Af Amer: >60 mL/min (ref >60)
GFR calc non Af Amer: >60 mL/min (ref >60)
GLUCOSE: 140 mg/dL — AB (ref 65–99)
Potassium: 4.2 mmol/L (ref 3.5–5.1)
SODIUM: 137 mmol/L (ref 135–145)

## 2016-11-24 LAB — CBC WITH DIFFERENTIAL/PLATELET
BASOS ABS: 0 10*3/uL (ref 0.0–0.1)
Basophils Relative: 0 %
Eosinophils Absolute: 0.1 10*3/uL (ref 0.0–0.7)
Eosinophils Relative: 2 %
HEMATOCRIT: 45.1 % (ref 39.0–52.0)
HEMOGLOBIN: 15.7 g/dL (ref 13.0–17.0)
LYMPHS PCT: 29 %
Lymphs Abs: 1.7 10*3/uL (ref 0.7–4.0)
MCH: 32.6 pg (ref 26.0–34.0)
MCHC: 34.8 g/dL (ref 30.0–36.0)
MCV: 93.8 fL (ref 78.0–100.0)
MONO ABS: 0.3 10*3/uL (ref 0.1–1.0)
Monocytes Relative: 5 %
NEUTROS ABS: 3.8 10*3/uL (ref 1.7–7.7)
Neutrophils Relative %: 65 %
Platelets: 274 10*3/uL (ref 150–400)
RBC: 4.81 MIL/uL (ref 4.22–5.81)
RDW: 12.6 % (ref 11.5–15.5)
WBC: 5.9 10*3/uL (ref 4.0–10.5)

## 2016-11-24 LAB — I-STAT TROPONIN, ED: Troponin i, poc: 0 ng/mL (ref 0.00–0.08)

## 2016-11-24 MED ORDER — METHYLPREDNISOLONE 4 MG PO TBPK: ORAL_TABLET | ORAL | 0 refills | Status: DC

## 2016-11-24 NOTE — ED Triage Notes (Signed)
Pt c/o L upper chest swelling post lifting a piano x 3 days ago, pt reports worse pain with not moving, pt MAE, pt A&O x4

## 2016-11-24 NOTE — ED Provider Notes (Signed)
[facility] EMERGENCY DEPARTMENT Provider Note   CSN: [id number] Arrival date & time: 11/24/16  1256     History   Chief Complaint Chief Complaint  Patient presents with  . Chest Pain    swelling to R pectoral area    HPI Ralph Daugherty is a 46 y.o. male with no significant past medical history, who presents to ED for evaluation of 3 day history of left-sided chest pain. Describes the pain as sharp and radiates down his chest. He states that symptoms began after helping his friend move a piano down 3 flights of stairs. He has tried icy hot with no relief in his symptoms. He reports pain worse with movement. He denies any shortness of breath, additional injury, prior MI, DVT, PE, hemoptysis, URI symptoms, fever.  HPI  History reviewed. No pertinent past medical history.  There are no active problems to display for this patient.   Past Surgical History:  Procedure Laterality Date  . CERVICAL FUSION         Home Medications    Prior to Admission medications   Medication Sig Start Date End Date Taking? Authorizing Provider  clindamycin (CLEOCIN) 150 MG capsule Take 2 capsules (300 mg total) by mouth every 6 (six) hours. 06/11/16   Ward, Chase Picket, PA-C  diphenhydrAMINE (BENADRYL) 25 MG tablet Take 1 tablet (25 mg total) by mouth every 6 (six) hours. 05/24/16   Renne Crigler, PA-C  diphenoxylate-atropine (LOMOTIL) 2.5-0.025 MG per tablet Take 1 tablet by mouth 4 (four) times daily as needed for diarrhea or loose stools. 03/25/14   Donnetta Hutching, MD  methylPREDNISolone (MEDROL DOSEPAK) 4 MG TBPK tablet Taper over 6 days. 11/24/16   Floy Riegler, PA-C  naproxen (NAPROSYN) 250 MG tablet Take 1 tablet (250 mg total) by mouth 2 (two) times daily with a meal. 05/23/16   Everlene Farrier, PA-C  predniSONE (DELTASONE) 20 MG tablet Take 2 tablets (40 mg total) by mouth daily. 05/24/16   Renne Crigler, PA-C  promethazine (PHENERGAN) 25 MG tablet Take 1 tablet (25  mg total) by mouth every 6 (six) hours as needed for nausea. 03/25/14   Donnetta Hutching, MD    Family History No family history on file.  Social History Social History  Substance Use Topics  . Smoking status: Former Smoker    Quit date: 1998  . Smokeless tobacco: Never Used  . Alcohol use Yes     Comment: 40 oz every day     Allergies   Penicillins   Review of Systems Review of Systems  Constitutional: Negative for appetite change, chills and fever.  HENT: Negative for ear pain, rhinorrhea, sneezing and sore throat.   Eyes: Negative for photophobia and visual disturbance.  Respiratory: Negative for cough, chest tightness, shortness of breath and wheezing.   Cardiovascular: Positive for chest pain. Negative for palpitations.  Gastrointestinal: Negative for abdominal pain, blood in stool, constipation, diarrhea, nausea and vomiting.  Genitourinary: Negative for dysuria, hematuria and urgency.  Musculoskeletal: Negative for myalgias.  Skin: Negative for rash.  Neurological: Negative for dizziness, weakness and light-headedness.     Physical Exam Updated Vital Signs BP 120/73 (BP Location: Left Arm)   Pulse 83   Temp 97.8 F (36.6 C) (Oral)   Resp 17   SpO2 99%   Physical Exam  Constitutional: He appears well-developed and well-nourished. No distress.  Nontoxic appearing and in no acute distress.  HENT:  Head: Normocephalic and atraumatic.  Nose: Nose normal.  Eyes:  Conjunctivae and EOM are normal. Left eye exhibits no discharge. No scleral icterus.  Neck: Normal range of motion. Neck supple.  Cardiovascular: Normal rate, regular rhythm, normal heart sounds and intact distal pulses.  Exam reveals no gallop and no friction rub.   No murmur heard. Pulmonary/Chest: Effort normal and breath sounds normal. No respiratory distress. He exhibits tenderness.    Abdominal: Soft. Bowel sounds are normal. He exhibits no distension. There is no tenderness. There is no guarding.    Musculoskeletal: Normal range of motion. He exhibits no edema.  Neurological: He is alert. He exhibits normal muscle tone. Coordination normal.  Skin: Skin is warm and dry. No rash noted.  Psychiatric: He has a normal mood and affect.  Nursing note and vitals reviewed.    ED Treatments / Results  Labs (all labs ordered are listed, but only abnormal results are displayed) Labs Reviewed  BASIC METABOLIC PANEL - Abnormal; Notable for the following:       Result Value   Glucose, Bld 140 (*)    All other components within normal limits  CBC WITH DIFFERENTIAL/PLATELET  I-STAT TROPONIN, ED    EKG  EKG Interpretation  Date/Time:  Saturday November 24 2016 13:51:46 EDT Ventricular Rate:  68 PR Interval:  140 QRS Duration: 88 QT Interval:  362 QTC Calculation: 384 R Axis:   2 Text Interpretation:  Normal sinus rhythm Possible Left atrial enlargement Borderline ECG When compared with ECG of 12/14/2015, No significant change was found Confirmed by Dione Booze ([id number]) on 11/24/2016 2:12:05 PM       Radiology Dg Chest 2 View  Result Date: 11/24/2016 CLINICAL DATA:  Possible pulled muscle while lifting. EXAM: CHEST  2 VIEW COMPARISON:  12/15/2015 FINDINGS: The heart size and mediastinal contours are within normal limits. Both lungs are clear. The visualized skeletal structures are unremarkable. IMPRESSION: No active cardiopulmonary disease. Electronically Signed   By: Elberta Fortis M.D.   On: 11/24/2016 14:11    Procedures Procedures (including critical care time)  Medications Ordered in ED Medications - No data to display   Initial Impression / Assessment and Plan / ED Course  I have reviewed the triage vital signs and the nursing notes.  Pertinent labs & imaging results that were available during my care of the patient were reviewed by me and considered in my medical decision making (see chart for details).     Patient presents to ED for evaluation of left-sided chest  pain for the past 3 days. He is unsure if she pulled a muscle in the area after helping his friend move a p.m. It down 3 flights of stairs. He is also concerned that it could be his heart that is causing his pain. He has tried icy hot with no relief in his symptoms. Physical exam he is nontoxic-appearing and in no distress. Lungs are clear to auscultation bilaterally. Heart rate, blood pressure, respiratory rate and oxygen saturations unremarkable and all within normal limits. He is afebrile with no history of fever. Chest pain is reproducible with palpation. I see no visible swelling, color or temperature change on the left side of the chest.EKG showed tracing similar to previous. Chest x-ray with no acute process. Troponin, CBC, BMP all unremarkable. I suspect that his symptoms are due to chest wall pain rather than cardiac or pulmonary cause based on his history, physical exam findings, lab work today. We'll treat with steroids and advise Tylenol to be taken as needed. Advised to apply heat in the  area as tolerated. Patient appears stable for discharge at this time. Strict return precautions given.  Final Clinical Impressions(s) / ED Diagnoses   Final diagnoses:  Chest wall pain    New Prescriptions New Prescriptions   METHYLPREDNISOLONE (MEDROL DOSEPAK) 4 MG TBPK TABLET    Taper over 6 days.     Dietrich PatesKhatri, Margarit Minshall, PA-C 11/24/16 1518    Dione BoozeGlick, David, MD 11/25/16 [phone]

## 2016-11-24 NOTE — Discharge Instructions (Signed)
Please read attached information regarding your condition. Take steroids and tapered dose pack as directed. Take Tylenol as needed for pain. Apply heat to affected area as tolerated. Return to ED for worsening chest pain, trouble breathing, coughing up blood, fevers.

## 2016-11-24 NOTE — ED Notes (Signed)
Declined W/C at D/C and was escorted to lobby by RN. 

## 2017-02-16 ENCOUNTER — Emergency Department (HOSPITAL_COMMUNITY)
Admission: EM | Admit: 2017-02-16 | Discharge: 2017-02-16 | Disposition: A | Discharge status: Home / Self Care | Payer: No Typology Code available for payment source | Attending: Emergency Medicine | Admitting: Emergency Medicine

## 2017-02-16 ENCOUNTER — Encounter (HOSPITAL_COMMUNITY): Payer: Self-pay | Admitting: Emergency Medicine

## 2017-02-16 ENCOUNTER — Emergency Department (HOSPITAL_COMMUNITY): Payer: No Typology Code available for payment source

## 2017-02-16 DIAGNOSIS — M25561 Pain in right knee: Secondary | ICD-10-CM

## 2017-02-16 DIAGNOSIS — Z87891 Personal history of nicotine dependence: Secondary | ICD-10-CM | POA: Diagnosis not present

## 2017-02-16 DIAGNOSIS — S0083XA Contusion of other part of head, initial encounter: Secondary | ICD-10-CM | POA: Insufficient documentation

## 2017-02-16 DIAGNOSIS — Y9389 Activity, other specified: Secondary | ICD-10-CM | POA: Insufficient documentation

## 2017-02-16 DIAGNOSIS — Z79899 Other long term (current) drug therapy: Secondary | ICD-10-CM | POA: Insufficient documentation

## 2017-02-16 DIAGNOSIS — Y9289 Other specified places as the place of occurrence of the external cause: Secondary | ICD-10-CM | POA: Insufficient documentation

## 2017-02-16 DIAGNOSIS — Y999 Unspecified external cause status: Secondary | ICD-10-CM | POA: Diagnosis not present

## 2017-02-16 DIAGNOSIS — S0990XA Unspecified injury of head, initial encounter: Secondary | ICD-10-CM | POA: Diagnosis present

## 2017-02-16 MED ORDER — IBUPROFEN 400 MG PO TABS
600.0000 mg | ORAL_TABLET | Freq: Once | ORAL | Status: AC
  Administered 2017-02-16: 11:00:00 600 mg via ORAL
  Filled 2017-02-16: qty 1

## 2017-02-16 MED ORDER — IBUPROFEN 600 MG PO TABS: 600.0000 mg | ORAL_TABLET | Freq: Four times a day (QID) | ORAL | 0 refills | Status: DC | PRN

## 2017-02-16 NOTE — ED Notes (Signed)
Pt verbalized understanding of all d/c instructions and prescriptions. VSS. All belongings went home with pt
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
## 2017-02-16 NOTE — Discharge Instructions (Signed)
Rest - please stay off right leg as much as possible for a couple of days Ice - ice for 20 minutes at a time, several times a day Compression - wear brace to provide support Elevate - elevate ankle above level of heart Ibuprofen - take with food. Take up to 3-4 times daily Follow up with orthopedics
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
## 2017-05-21 ENCOUNTER — Emergency Department (HOSPITAL_COMMUNITY)
Admission: EM | Admit: 2017-05-21 | Discharge: 2017-05-21 | Disposition: A | Discharge status: Home / Self Care | Payer: No Typology Code available for payment source | Attending: Emergency Medicine | Admitting: Emergency Medicine

## 2017-05-21 ENCOUNTER — Encounter (HOSPITAL_COMMUNITY): Payer: Self-pay | Admitting: Emergency Medicine

## 2017-05-21 DIAGNOSIS — Z79899 Other long term (current) drug therapy: Secondary | ICD-10-CM | POA: Insufficient documentation

## 2017-05-21 DIAGNOSIS — K0889 Other specified disorders of teeth and supporting structures: Secondary | ICD-10-CM | POA: Insufficient documentation

## 2017-05-21 MED ORDER — CLINDAMYCIN HCL 150 MG PO CAPS: 450.0000 mg | ORAL_CAPSULE | Freq: Four times a day (QID) | ORAL | 0 refills | Status: AC

## 2017-05-21 MED ORDER — CLINDAMYCIN HCL 150 MG PO CAPS
450.0000 mg | ORAL_CAPSULE | Freq: Once | ORAL | Status: AC
  Administered 2017-05-21: 450 mg via ORAL
  Filled 2017-05-21: qty 3

## 2017-05-21 NOTE — ED Provider Notes (Addendum)
[facility] EMERGENCY DEPARTMENT Provider Note   CSN: [id number] Arrival date & time: 05/21/17  1625     History   Chief Complaint Chief Complaint  Patient presents with  . Dental Pain    HPI Ralph Daugherty is a 47 y.o. male here for evaluation of sudden onset, 10/10, dental pain to the right upper gumline that began last night. Aggravating factors include a chewing food, palpation. No alleviating factors however he has not tried any interventions PTA. He is concerned there may be an infection. States he recently obtained health insurance and had 2 other teeth extracted by a dentist, he has an appointment in 6 days with a dentist to have 2 other teeth pulled. He denies any fevers, chills, facial swelling, draining from the tooth or gum, trismus. States the gum area that is bothering him is where he pulled a tooth himself many years ago.  HPI  History reviewed. No pertinent past medical history.  There are no active problems to display for this patient.   Past Surgical History:  Procedure Laterality Date  . CERVICAL FUSION          Home Medications    Prior to Admission medications   Medication Sig Start Date End Date Taking? Authorizing Provider  clindamycin (CLEOCIN) 150 MG capsule Take 3 capsules (450 mg total) by mouth every 6 (six) hours for 7 days. 05/21/17 05/28/17  Liberty Handy, PA-C  diphenhydrAMINE (BENADRYL) 25 MG tablet Take 1 tablet (25 mg total) by mouth every 6 (six) hours. 05/24/16   Renne Crigler, PA-C  diphenoxylate-atropine (LOMOTIL) 2.5-0.025 MG per tablet Take 1 tablet by mouth 4 (four) times daily as needed for diarrhea or loose stools. 03/25/14   Donnetta Hutching, MD  ibuprofen (ADVIL,MOTRIN) 600 MG tablet Take 1 tablet (600 mg total) by mouth every 6 (six) hours as needed. 02/16/17   Bethel Born, PA-C  methylPREDNISolone (MEDROL DOSEPAK) 4 MG TBPK tablet Taper over 6 days. 11/24/16   Khatri, Hina, PA-C  naproxen (NAPROSYN)  250 MG tablet Take 1 tablet (250 mg total) by mouth 2 (two) times daily with a meal. 05/23/16   Everlene Farrier, PA-C  predniSONE (DELTASONE) 20 MG tablet Take 2 tablets (40 mg total) by mouth daily. 05/24/16   Renne Crigler, PA-C  promethazine (PHENERGAN) 25 MG tablet Take 1 tablet (25 mg total) by mouth every 6 (six) hours as needed for nausea. 03/25/14   Donnetta Hutching, MD    Family History No family history on file.  Social History Social History   Tobacco Use  . Smoking status: Former Smoker    Last attempt to quit: 1998    Years since quitting: 21.3  . Smokeless tobacco: Never Used  Substance Use Topics  . Alcohol use: Yes    Comment: 40 oz every day  . Drug use: Yes    Types: Marijuana     Allergies   Penicillins   Review of Systems Review of Systems  HENT: Positive for dental problem.   All other systems reviewed and are negative.    Physical Exam Updated Vital Signs BP (!) 154/105   Pulse 80   Temp 98.6 F (37 C) (Oral)   Resp (!) 24   SpO2 100%   Physical Exam  Constitutional: He is oriented to person, place, and time. He appears well-developed and well-nourished.  Non-toxic appearance.  HENT:  Head: Normocephalic.  Right Ear: External ear normal.  Left Ear: External ear normal.  Nose:  Nose normal.  Poor dentition with multiple missing and cracked teeth. Obvious deep caries noted throughout. Teeth #3 and 4 missing with tiny pieces of cracked teeth within the gums in this area, tender. No surrounding gum line edema, fluctuance, abscess. No trismus. No sublingual edema or tenderness. Phonation normal.  Eyes: Conjunctivae and EOM are normal.  Neck: Full passive range of motion without pain.  Cardiovascular: Normal rate.  Pulmonary/Chest: Effort normal. No tachypnea. No respiratory distress.  Musculoskeletal: Normal range of motion.  Neurological: He is alert and oriented to person, place, and time.  Skin: Skin is warm and dry. Capillary refill takes less  than 2 seconds.  Psychiatric: His behavior is normal. Thought content normal.     ED Treatments / Results  Labs (all labs ordered are listed, but only abnormal results are displayed) Labs Reviewed - No data to display  EKG None  Radiology No results found.  Procedures Procedures (including critical care time)  Medications Ordered in ED Medications  clindamycin (CLEOCIN) capsule 450 mg (has no administration in time range)     Initial Impression / Assessment and Plan / ED Course  I have reviewed the triage vital signs and the nursing notes.  Pertinent labs & imaging results that were available during my care of the patient were reviewed by me and considered in my medical decision making (see chart for details).     47 year old with dental pain associated with cracked teeth that he extracted himself many years ago. No signs of dental abscess on exam. Patient afebrile, nontoxic, lying secretions well without changes in phonation. Exam on concerning for Ludwig angina or other deep tissue infection in the neck or mouth. As there is focal tenderness in setting of poor dentition, patient is high risk for infection. He has pieces of cracked teeth in the gum where he has pain which could be the source of an infection. We will cover him with clindamycin. NSAIDs for pain. He has scheduled dental appointment in 6 days. Encouraged close follow-up. Discussed return precautions. Patient verbalized understanding and agreeable with ED treatment and discharge plan  Final Clinical Impressions(s) / ED Diagnoses   Final diagnoses:  Pain, dental    ED Discharge Orders        Ordered    clindamycin (CLEOCIN) 150 MG capsule  Every 6 hours     05/21/17 1821        Liberty HandyGibbons, Avrom Robarts J, PA-C 05/21/17 1821    Long, Arlyss RepressJoshua G, MD 05/22/17 1001

## 2017-05-21 NOTE — Discharge Instructions (Addendum)
No evidence of dental or gum line abscess today. Pain may be from an infection or exposed sensitive dental tissue or nerves. Take clindamycin as prescribed for possible infection. For pain take 1000 mg of acetaminophen (tylenol) +600 mg of ibuprofen (aleve, advil) every 6-8 hours for the next 3-5 days. Follow-up with your dentist appointment in the next 6 days. Return if you have facial swelling, fevers, difficulty opening jaw, changes in voice.

## 2017-05-21 NOTE — ED Triage Notes (Signed)
Pt with upper R dental pain starting last night. Pt has missing teeth and dental caries. Pain 10/10.

## 2017-08-22 ENCOUNTER — Encounter (HOSPITAL_COMMUNITY): Payer: Self-pay | Admitting: Emergency Medicine

## 2017-08-22 ENCOUNTER — Ambulatory Visit (HOSPITAL_COMMUNITY)
Admission: EM | Admit: 2017-08-22 | Discharge: 2017-08-22 | Disposition: A | Discharge status: Home / Self Care | Payer: Self-pay | Attending: Internal Medicine | Admitting: Internal Medicine

## 2017-08-22 DIAGNOSIS — K047 Periapical abscess without sinus: Secondary | ICD-10-CM

## 2017-08-22 DIAGNOSIS — B353 Tinea pedis: Secondary | ICD-10-CM

## 2017-08-22 MED ORDER — CLINDAMYCIN HCL 300 MG PO CAPS: 300.0000 mg | ORAL_CAPSULE | Freq: Two times a day (BID) | ORAL | 0 refills | Status: AC

## 2017-08-22 MED ORDER — KETOCONAZOLE 2 % EX CREA: 1.0000 "application " | TOPICAL_CREAM | Freq: Every day | CUTANEOUS | 1 refills | Status: DC

## 2017-08-22 NOTE — Discharge Instructions (Addendum)
Prescriptions for ketoconazole cream (for foot fungus) and clindamycin (antibiotic for tooth infection) sent to pharmacy.  Anticipate gradual improvement over the next several days in dental pain and foot itching.  Recheck as needed.

## 2017-08-22 NOTE — ED Triage Notes (Signed)
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 Dental pain as well.

## 2017-08-22 NOTE — ED Provider Notes (Signed)
[facility]    CSN: [id number] Arrival date & time: 08/22/17  1452     History   Chief Complaint Chief Complaint  Patient presents with  . Foot Pain    HPI Ralph Daugherty is a 47 y.o. male.   He presents today with itching of the sole of the left foot and some itching and maceration between the toes, this has been going on for couple of weeks.  He has also got toothache in a right upper molar, this is the carious remnant of a tooth.  He is planning to have this extracted by the dentist when he has the funds.  No fever, no malaise.    HPI  History reviewed. No pertinent past medical history.   Past Surgical History:  Procedure Laterality Date  . CERVICAL FUSION         Home Medications    Prior to Admission medications   Medication Sig Start Date End Date Taking? Authorizing Provider  clindamycin (CLEOCIN) 300 MG capsule Take 1 capsule (300 mg total) by mouth 2 (two) times daily for 10 days. 08/22/17 09/01/17  Isa RankinMurray, Carlisia Geno Wilson, MD  diphenhydrAMINE (BENADRYL) 25 MG tablet Take 1 tablet (25 mg total) by mouth every 6 (six) hours. 05/24/16   Renne CriglerGeiple, Joshua, PA-C  diphenoxylate-atropine (LOMOTIL) 2.5-0.025 MG per tablet Take 1 tablet by mouth 4 (four) times daily as needed for diarrhea or loose stools. 03/25/14   Donnetta Hutchingook, Brian, MD  ibuprofen (ADVIL,MOTRIN) 600 MG tablet Take 1 tablet (600 mg total) by mouth every 6 (six) hours as needed. 02/16/17   Bethel BornGekas, Kelly Marie, PA-C  ketoconazole (NIZORAL) 2 % cream Apply 1 application topically daily. 08/22/17   Isa RankinMurray, Alizea Pell Wilson, MD  methylPREDNISolone (MEDROL DOSEPAK) 4 MG TBPK tablet Taper over 6 days. 11/24/16   Khatri, Hina, PA-C  naproxen (NAPROSYN) 250 MG tablet Take 1 tablet (250 mg total) by mouth 2 (two) times daily with a meal. 05/23/16   Everlene Farrieransie, William, PA-C  predniSONE (DELTASONE) 20 MG tablet Take 2 tablets (40 mg total) by mouth daily. 05/24/16   Renne CriglerGeiple, Joshua, PA-C  promethazine (PHENERGAN) 25 MG tablet  Take 1 tablet (25 mg total) by mouth every 6 (six) hours as needed for nausea. 03/25/14   Donnetta Hutchingook, Brian, MD    Family History No family history on file.  Social History Social History   Tobacco Use  . Smoking status: Former Smoker    Last attempt to quit: 1998    Years since quitting: 21.5  . Smokeless tobacco: Never Used  Substance Use Topics  . Alcohol use: Yes    Comment: 40 oz every day  . Drug use: Yes    Types: Marijuana     Allergies   Penicillins   Review of Systems Review of Systems  All other systems reviewed and are negative.    Physical Exam Triage Vital Signs ED Triage Vitals  Enc Vitals Group     BP 08/22/17 1540 139/87     Pulse Rate 08/22/17 1540 75     Resp 08/22/17 1540 16     Temp 08/22/17 1540 98.3 F (36.8 C)     Temp Source 08/22/17 1540 Oral     SpO2 08/22/17 1540 96 %     Weight --      Height --      Pain Score 08/22/17 1541 0     Pain Loc --    Updated Vital Signs BP 139/87   Pulse 75  Temp 98.3 F (36.8 C) (Oral)   Resp 16   SpO2 96%  Physical Exam  Constitutional: He is oriented to person, place, and time. No distress.  Alert, nicely groomed  HENT:  Head: Atraumatic.  Broken right upper molar, with some inflamed gingiva around the roots.  No facial swelling.  Eyes:  Conjugate gaze, no eye redness/drainage  Neck: Neck supple.  Cardiovascular: Normal rate.  Pulmonary/Chest: No respiratory distress.  Lungs clear, symmetric breath sounds  Abdominal: Soft. He exhibits no distension. There is no tenderness. There is no guarding.  Musculoskeletal: Normal range of motion.  Neurological: He is alert and oriented to person, place, and time.  Skin: Skin is warm and dry.  No cyanosis Sole of the left foot has scaling rash consistent with tinea, and maceration between the third fourth and fifth toes.  Nursing note and vitals reviewed.    Final Clinical Impressions(s) / UC Diagnoses   Final diagnoses:  Tinea pedis of left  foot  Dental infection     Discharge Instructions     Prescriptions for ketoconazole cream (for foot fungus) and clindamycin (antibiotic for tooth infection) sent to pharmacy.  Anticipate gradual improvement over the next several days in dental pain and foot itching.  Recheck as needed.   ED Prescriptions    Medication Sig Dispense Auth. Provider   clindamycin (CLEOCIN) 300 MG capsule Take 1 capsule (300 mg total) by mouth 2 (two) times daily for 10 days. 20 capsule Isa Rankin, MD   ketoconazole (NIZORAL) 2 % cream Apply 1 application topically daily. 60 g Isa Rankin, MD       Isa Rankin, MD 08/25/17 [phone]

## 2018-01-21 ENCOUNTER — Other Ambulatory Visit: Payer: Self-pay

## 2018-01-21 ENCOUNTER — Encounter (HOSPITAL_COMMUNITY): Payer: Self-pay | Admitting: Emergency Medicine

## 2018-01-21 ENCOUNTER — Emergency Department (HOSPITAL_COMMUNITY)
Admission: EM | Admit: 2018-01-21 | Discharge: 2018-01-21 | Disposition: A | Discharge status: Home / Self Care | Payer: No Typology Code available for payment source | Attending: Emergency Medicine | Admitting: Emergency Medicine

## 2018-01-21 DIAGNOSIS — F1290 Cannabis use, unspecified, uncomplicated: Secondary | ICD-10-CM | POA: Insufficient documentation

## 2018-01-21 DIAGNOSIS — K029 Dental caries, unspecified: Secondary | ICD-10-CM | POA: Diagnosis not present

## 2018-01-21 DIAGNOSIS — Z87891 Personal history of nicotine dependence: Secondary | ICD-10-CM | POA: Insufficient documentation

## 2018-01-21 DIAGNOSIS — K0889 Other specified disorders of teeth and supporting structures: Secondary | ICD-10-CM | POA: Diagnosis not present

## 2018-01-21 MED ORDER — CLINDAMYCIN HCL 150 MG PO CAPS: 450.0000 mg | ORAL_CAPSULE | Freq: Four times a day (QID) | ORAL | 0 refills | Status: AC

## 2018-01-21 MED ORDER — NAPROXEN 500 MG PO TABS: 500.0000 mg | ORAL_TABLET | Freq: Two times a day (BID) | ORAL | 0 refills | Status: DC

## 2018-01-21 MED ORDER — IBUPROFEN 400 MG PO TABS
600.0000 mg | ORAL_TABLET | Freq: Once | ORAL | Status: AC
  Administered 2018-01-21: 09:00:00 600 mg via ORAL
  Filled 2018-01-21: qty 1

## 2018-01-21 MED ORDER — CLINDAMYCIN HCL 150 MG PO CAPS
450.0000 mg | ORAL_CAPSULE | Freq: Once | ORAL | Status: AC
  Administered 2018-01-21: 450 mg via ORAL
  Filled 2018-01-21: qty 3

## 2018-01-21 NOTE — ED Triage Notes (Signed)
Pt states his tooth broke off the other day when he was eating. Today pt noticed the right side of his face was swollen.

## 2018-01-21 NOTE — ED Notes (Signed)
ED Provider at bedside. 

## 2018-01-21 NOTE — ED Provider Notes (Signed)
[facility] Provider Note   CSN: [id number] Arrival date & time: 01/21/18  [id number]     History   Chief Complaint Chief Complaint  Patient presents with  . Dental Pain    HPI Ralph Daugherty is a 47 y.o. male who presents to ED for 3-day history of gradually worsening right upper dental pain.  Describes the pain as sharp, feels like "someone punched me there."  Worse with eating and palpation.  States that he was eating some hard candy 3 days ago when an area of his tooth broke off.  He has had similar symptoms on the left side of his face and needed dental work done earlier in this year.  He has not seen his dentist for the past 10 months.  He took 1 Tylenol this morning with no improvement in his symptoms.  Denies any trouble breathing, trouble swallowing, trismus, drooling, fever, changes in voice or neck pain.  HPI  History reviewed. No pertinent past medical history.  There are no active problems to display for this patient.   Past Surgical History:  Procedure Laterality Date  . CERVICAL FUSION          Home Medications    Prior to Admission medications   Medication Sig Start Date End Date Taking? Authorizing Provider  clindamycin (CLEOCIN) 150 MG capsule Take 3 capsules (450 mg total) by mouth every 6 (six) hours for 7 days. 01/21/18 01/28/18  Michale Emmerich, PA-C  diphenhydrAMINE (BENADRYL) 25 MG tablet Take 1 tablet (25 mg total) by mouth every 6 (six) hours. 05/24/16   Renne CriglerGeiple, Joshua, PA-C  diphenoxylate-atropine (LOMOTIL) 2.5-0.025 MG per tablet Take 1 tablet by mouth 4 (four) times daily as needed for diarrhea or loose stools. 03/25/14   Donnetta Hutchingook, Brian, MD  ibuprofen (ADVIL,MOTRIN) 600 MG tablet Take 1 tablet (600 mg total) by mouth every 6 (six) hours as needed. 02/16/17   Bethel BornGekas, Kelly Marie, PA-C  ketoconazole (NIZORAL) 2 % cream Apply 1 application topically daily. 08/22/17   Isa RankinMurray, Laura Wilson, MD  methylPREDNISolone (MEDROL  DOSEPAK) 4 MG TBPK tablet Taper over 6 days. 11/24/16   Henryetta Corriveau, PA-C  naproxen (NAPROSYN) 500 MG tablet Take 1 tablet (500 mg total) by mouth 2 (two) times daily. 01/21/18   Lavergne Hiltunen, PA-C  predniSONE (DELTASONE) 20 MG tablet Take 2 tablets (40 mg total) by mouth daily. 05/24/16   Renne CriglerGeiple, Joshua, PA-C  promethazine (PHENERGAN) 25 MG tablet Take 1 tablet (25 mg total) by mouth every 6 (six) hours as needed for nausea. 03/25/14   Donnetta Hutchingook, Brian, MD    Family History No family history on file.  Social History Social History   Tobacco Use  . Smoking status: Former Smoker    Last attempt to quit: 1998    Years since quitting: 21.9  . Smokeless tobacco: Never Used  Substance Use Topics  . Alcohol use: Yes    Comment: 40 oz every day  . Drug use: Yes    Types: Marijuana     Allergies   Penicillins   Review of Systems Review of Systems  Constitutional: Negative for chills and fever.  HENT: Positive for dental problem. Negative for drooling, sore throat and trouble swallowing.   Respiratory: Negative for shortness of breath.   Musculoskeletal: Negative for neck pain.     Physical Exam Updated Vital Signs BP (!) 129/92 (BP Location: Right Arm)   Pulse 93   Temp 98 F (36.7 C) (Oral)  Resp 16   SpO2 99%   Physical Exam Vitals signs and nursing note reviewed.  Constitutional:      General: He is not in acute distress.    Appearance: He is well-developed. He is not diaphoretic.  HENT:     Head: Normocephalic and atraumatic.     Mouth/Throat:     Dentition: Abnormal dentition. Dental caries present. No dental abscesses.      Comments: Tenderness to palpation over the indicated gumline with no gross dental abscess or site of drainage noted. Overall poor dentition with several missing and chipped teeth noted. No facial, neck or cheek swelling noted. No pooling of secretions or trismus.  Normal voice noted with no difficulty swallowing or breathing. No submandibular  erythema, edema or crepitus noted. Normal phonation. Eyes:     General: No scleral icterus.    Conjunctiva/sclera: Conjunctivae normal.  Neck:     Musculoskeletal: Normal range of motion.  Pulmonary:     Effort: Pulmonary effort is normal. No respiratory distress.  Skin:    Findings: No rash.  Neurological:     Mental Status: He is alert.      ED Treatments / Results  Labs (all labs ordered are listed, but only abnormal results are displayed) Labs Reviewed - No data to display  EKG None  Radiology No results found.  Procedures Procedures (including critical care time)  Medications Ordered in ED Medications  clindamycin (CLEOCIN) capsule 450 mg (has no administration in time range)  ibuprofen (ADVIL,MOTRIN) tablet 600 mg (has no administration in time range)     Initial Impression / Assessment and Plan / ED Course  I have reviewed the triage vital signs and the nursing notes.  Pertinent labs & imaging results that were available during my care of the patient were reviewed by me and considered in my medical decision making (see chart for details).     Patient with dentalgia. On exam, there is no evidence of a drainable abscess. No trismus, glossal elevation, unilateral tonsillar swelling. No evidence of retropharyngeal or peritonsillar abscess or Ludwig angina. Will treat with  clindamycin and naproxen.  He has a Education officer, communitydentist but he states it does not have enough money to follow-up at this moment. Pt instructed to follow-up with dentist as soon as possible. Resource guide provided with AVS.  Patient is hemodynamically stable, in NAD, and able to ambulate in the ED. Evaluation does not show pathology that would require ongoing emergent intervention or inpatient treatment. I explained the diagnosis to the patient. Pain has been managed and has no complaints prior to discharge. Patient is comfortable with above plan and is stable for discharge at this time. All questions were  answered prior to disposition. Strict return precautions for returning to the ED were discussed. Encouraged follow up with PCP.    Portions of this note were generated with Scientist, clinical (histocompatibility and immunogenetics)Dragon dictation software. Dictation errors may occur despite best attempts at proofreading.  Final Clinical Impressions(s) / ED Diagnoses   Final diagnoses:  Pain, dental    ED Discharge Orders         Ordered    clindamycin (CLEOCIN) 150 MG capsule  Every 6 hours     01/21/18 0920    naproxen (NAPROSYN) 500 MG tablet  2 times daily     01/21/18 0920           Dietrich PatesKhatri, Alyna Stensland, PA-C 01/21/18 [id number]    Charlynne PanderYao, David Hsienta, MD 01/22/18 [phone]

## 2018-01-21 NOTE — ED Notes (Signed)
Patient verbalizes understanding of discharge instructions. Opportunity for questioning and answers were provided. Armband removed by staff, pt discharged from ED ambulatory.   

## 2018-01-21 NOTE — Discharge Instructions (Signed)
Complete the antibiotics as prescribed.  This will prevent worsening or recurrence of your infection. Return to ED for worsening symptoms, increased swelling, trouble breathing or trouble swallowing, trouble opening your mouth.

## 2018-05-01 ENCOUNTER — Other Ambulatory Visit: Payer: Self-pay

## 2018-05-01 ENCOUNTER — Encounter (HOSPITAL_COMMUNITY): Payer: Self-pay | Admitting: Family Medicine

## 2018-05-01 ENCOUNTER — Ambulatory Visit (HOSPITAL_COMMUNITY)
Admission: EM | Admit: 2018-05-01 | Discharge: 2018-05-01 | Disposition: A | Discharge status: Home / Self Care | Payer: Self-pay | Attending: Family Medicine | Admitting: Family Medicine

## 2018-05-01 DIAGNOSIS — Z114 Encounter for screening for human immunodeficiency virus [HIV]: Secondary | ICD-10-CM

## 2018-05-01 NOTE — Discharge Instructions (Signed)
We have drawn your blood to screen for HIV We will call with any positive results

## 2018-05-01 NOTE — ED Provider Notes (Signed)
[facility]    CSN: [id number] Arrival date & time: 05/01/18  1055     History   Chief Complaint Chief Complaint  Patient presents with  . SEXUALLY TRANSMITTED DISEASE    HPI Ralph Daugherty is a 48 y.o. male.   Pt is 48 year old male that presents for HIV screening. This is for a job. Denies any concern for HIV or high risk sexual behavior. He is not having any symptoms and feels well.        History reviewed. No pertinent past medical history.  There are no active problems to display for this patient.   Past Surgical History:  Procedure Laterality Date  . CERVICAL FUSION         Home Medications    Prior to Admission medications   Medication Sig Start Date End Date Taking? Authorizing Provider  ibuprofen (ADVIL,MOTRIN) 600 MG tablet Take 1 tablet (600 mg total) by mouth every 6 (six) hours as needed. 02/16/17   Bethel Born, PA-C  naproxen (NAPROSYN) 500 MG tablet Take 1 tablet (500 mg total) by mouth 2 (two) times daily. 01/21/18   Dietrich Pates, PA-C    Family History No family history on file.  Social History Social History   Tobacco Use  . Smoking status: Former Smoker    Last attempt to quit: 1998    Years since quitting: 22.2  . Smokeless tobacco: Never Used  Substance Use Topics  . Alcohol use: Yes    Comment: 40 oz every day  . Drug use: Yes    Types: Marijuana     Allergies   Penicillins   Review of Systems Review of Systems  Constitutional: Negative for appetite change, fatigue and fever.  Skin: Negative for rash.  Allergic/Immunologic: Negative for immunocompromised state.  Hematological: Negative for adenopathy. Does not bruise/bleed easily.     Physical Exam Triage Vital Signs ED Triage Vitals  Enc Vitals Group     BP 05/01/18 1114 (!) 139/93     Pulse Rate 05/01/18 1114 90     Resp 05/01/18 1114 17     Temp 05/01/18 1114 97.6 F (36.4 C)     Temp Source 05/01/18 1114 Oral     SpO2 05/01/18 1114  100 %     Weight --      Height --      Head Circumference --      Peak Flow --      Pain Score 05/01/18 1113 0     Pain Loc --      Pain Edu? --      Excl. in GC? --    No data found.  Updated Vital Signs BP (!) 139/93 (BP Location: Right Arm)   Pulse 90   Temp 97.6 F (36.4 C) (Oral)   Resp 17   SpO2 100%   Visual Acuity Right Eye Distance:   Left Eye Distance:   Bilateral Distance:    Right Eye Near:   Left Eye Near:    Bilateral Near:     Physical Exam Vitals signs and nursing note reviewed.  Constitutional:      Appearance: Normal appearance.  HENT:     Head: Normocephalic and atraumatic.     Nose: Nose normal.  Eyes:     Conjunctiva/sclera: Conjunctivae normal.  Pulmonary:     Effort: Pulmonary effort is normal.  Musculoskeletal: Normal range of motion.  Skin:    General: Skin is warm and dry.  Neurological:     Mental Status: He is alert.  Psychiatric:        Mood and Affect: Mood normal.      UC Treatments / Results  Labs (all labs ordered are listed, but only abnormal results are displayed) Labs Reviewed  HIV ANTIBODY (ROUTINE TESTING W REFLEX)    EKG None  Radiology No results found.  Procedures Procedures (including critical care time)  Medications Ordered in UC Medications - No data to display  Initial Impression / Assessment and Plan / UC Course  I have reviewed the triage vital signs and the nursing notes.  Pertinent labs & imaging results that were available during my care of the patient were reviewed by me and considered in my medical decision making (see chart for details).     HIV screening done   Final Clinical Impressions(s) / UC Diagnoses   Final diagnoses:  Encounter for special screening examination for HIV     Discharge Instructions     We have drawn your blood to screen for HIV We will call with any positive results    ED Prescriptions    None     Controlled Substance Prescriptions [place]  Controlled Substance Registry consulted? Not Applicable   Janace Aris, NP 05/01/18 1400

## 2018-05-01 NOTE — ED Triage Notes (Signed)
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 Pt also requesting COVID testing, but is aware this facility is not providing testing at this time.

## 2018-05-02 LAB — HIV ANTIBODY (ROUTINE TESTING W REFLEX): HIV Screen 4th Generation wRfx: NONREACTIVE

## 2018-08-25 ENCOUNTER — Emergency Department (HOSPITAL_COMMUNITY)
Admission: EM | Admit: 2018-08-25 | Discharge: 2018-08-25 | Disposition: A | Discharge status: Home / Self Care | Payer: No Typology Code available for payment source | Attending: Emergency Medicine | Admitting: Emergency Medicine

## 2018-08-25 ENCOUNTER — Other Ambulatory Visit: Payer: Self-pay

## 2018-08-25 ENCOUNTER — Encounter (HOSPITAL_COMMUNITY): Payer: Self-pay | Admitting: Emergency Medicine

## 2018-08-25 DIAGNOSIS — K047 Periapical abscess without sinus: Secondary | ICD-10-CM

## 2018-08-25 DIAGNOSIS — K0889 Other specified disorders of teeth and supporting structures: Secondary | ICD-10-CM | POA: Diagnosis not present

## 2018-08-25 DIAGNOSIS — Z87891 Personal history of nicotine dependence: Secondary | ICD-10-CM | POA: Diagnosis not present

## 2018-08-25 MED ORDER — CLINDAMYCIN HCL 150 MG PO CAPS: 300.0000 mg | ORAL_CAPSULE | Freq: Three times a day (TID) | ORAL | 0 refills | Status: DC

## 2018-08-25 MED ORDER — IBUPROFEN 800 MG PO TABS: 800.0000 mg | ORAL_TABLET | Freq: Three times a day (TID) | ORAL | 0 refills | Status: DC | PRN

## 2018-08-25 MED ORDER — TRAMADOL HCL 50 MG PO TABS: 50.0000 mg | ORAL_TABLET | Freq: Four times a day (QID) | ORAL | 0 refills | Status: DC | PRN

## 2018-08-25 NOTE — ED Triage Notes (Signed)
Rt sided facial swelling x 1 week bad tooth top rt

## 2018-08-25 NOTE — Discharge Instructions (Signed)
Return here as needed. Follow up with your dentist.  °

## 2018-08-25 NOTE — ED Provider Notes (Signed)
[facility] Provider Note   CSN: [id number] Arrival date & time: 08/25/18  1338     History   Chief Complaint Chief Complaint  Patient presents with  . Dental Pain    HPI Ralph Daugherty is a 48 y.o. male.     HPI Patient presents to the emergency department with dental pain and right-sided  jaw swelling.  The patient states he has a bad tooth on that side of his mouth.  Patient states that he does have a dentist but is not able to see them right away.  Patient states that nothing seems to make his condition better but palpation and certain movements of his mouth make the pain worse.  Patient denies any chest pain, shortness of breath, difficulty swallowing, difficulty breathing, weakness, dizziness, fever, nausea, vomiting or syncope. History reviewed. No pertinent past medical history.  There are no active problems to display for this patient.   Past Surgical History:  Procedure Laterality Date  . CERVICAL FUSION          Home Medications    Prior to Admission medications   Medication Sig Start Date End Date Taking? Authorizing Provider  ibuprofen (ADVIL,MOTRIN) 600 MG tablet Take 1 tablet (600 mg total) by mouth every 6 (six) hours as needed. 02/16/17   Recardo Evangelist, PA-C  naproxen (NAPROSYN) 500 MG tablet Take 1 tablet (500 mg total) by mouth 2 (two) times daily. 01/21/18   Delia Heady, PA-C    Family History No family history on file.  Social History Social History   Tobacco Use  . Smoking status: Former Smoker    Quit date: 1998    Years since quitting: 22.5  . Smokeless tobacco: Never Used  Substance Use Topics  . Alcohol use: Yes    Comment: 40 oz every day  . Drug use: Yes    Types: Marijuana     Allergies   Penicillins   Review of Systems Review of Systems  All other systems negative except as documented in the HPI. All pertinent positives and negatives as reviewed in the HPI. Physical  Exam Updated Vital Signs BP (!) 136/96 (BP Location: Right Arm)   Pulse 80   Temp 98.1 F (36.7 C) (Oral)   Resp 16   SpO2 98%   Physical Exam Vitals signs and nursing note reviewed.  Constitutional:      General: He is not in acute distress.    Appearance: He is well-developed.  HENT:     Head: Normocephalic and atraumatic.     Jaw: Tenderness and swelling present.     Mouth/Throat:   Eyes:     Pupils: Pupils are equal, round, and reactive to light.  Pulmonary:     Effort: Pulmonary effort is normal.  Skin:    General: Skin is warm and dry.  Neurological:     Mental Status: He is alert and oriented to person, place, and time.      ED Treatments / Results  Labs (all labs ordered are listed, but only abnormal results are displayed) Labs Reviewed - No data to display  EKG None  Radiology No results found.  Procedures Procedures (including critical care time)  Medications Ordered in ED Medications - No data to display   Initial Impression / Assessment and Plan / ED Course  I have reviewed the triage vital signs and the nursing notes.  Pertinent labs & imaging results that were available during my care of the  patient were reviewed by me and considered in my medical decision making (see chart for details).        Patient be referred to dentistry.  Placed on antibiotics.  Told to return here for any worsening in his condition.  Final Clinical Impressions(s) / ED Diagnoses   Final diagnoses:  None    ED Discharge Orders    None       Charlestine NightLawyer, Naomie Crow, PA-C 09/01/18 [id number]    Tilden Fossaees, Elizabeth, MD 09/01/18 [phone]

## 2019-01-15 IMAGING — CR DG CHEST 2V
2 series · 2 of 2 positions shown · non-contrast
Comparison: 12/15/2015

CLINICAL DATA: Possible pulled muscle while lifting.

EXAM:
CHEST  2 VIEW

[chest pa]
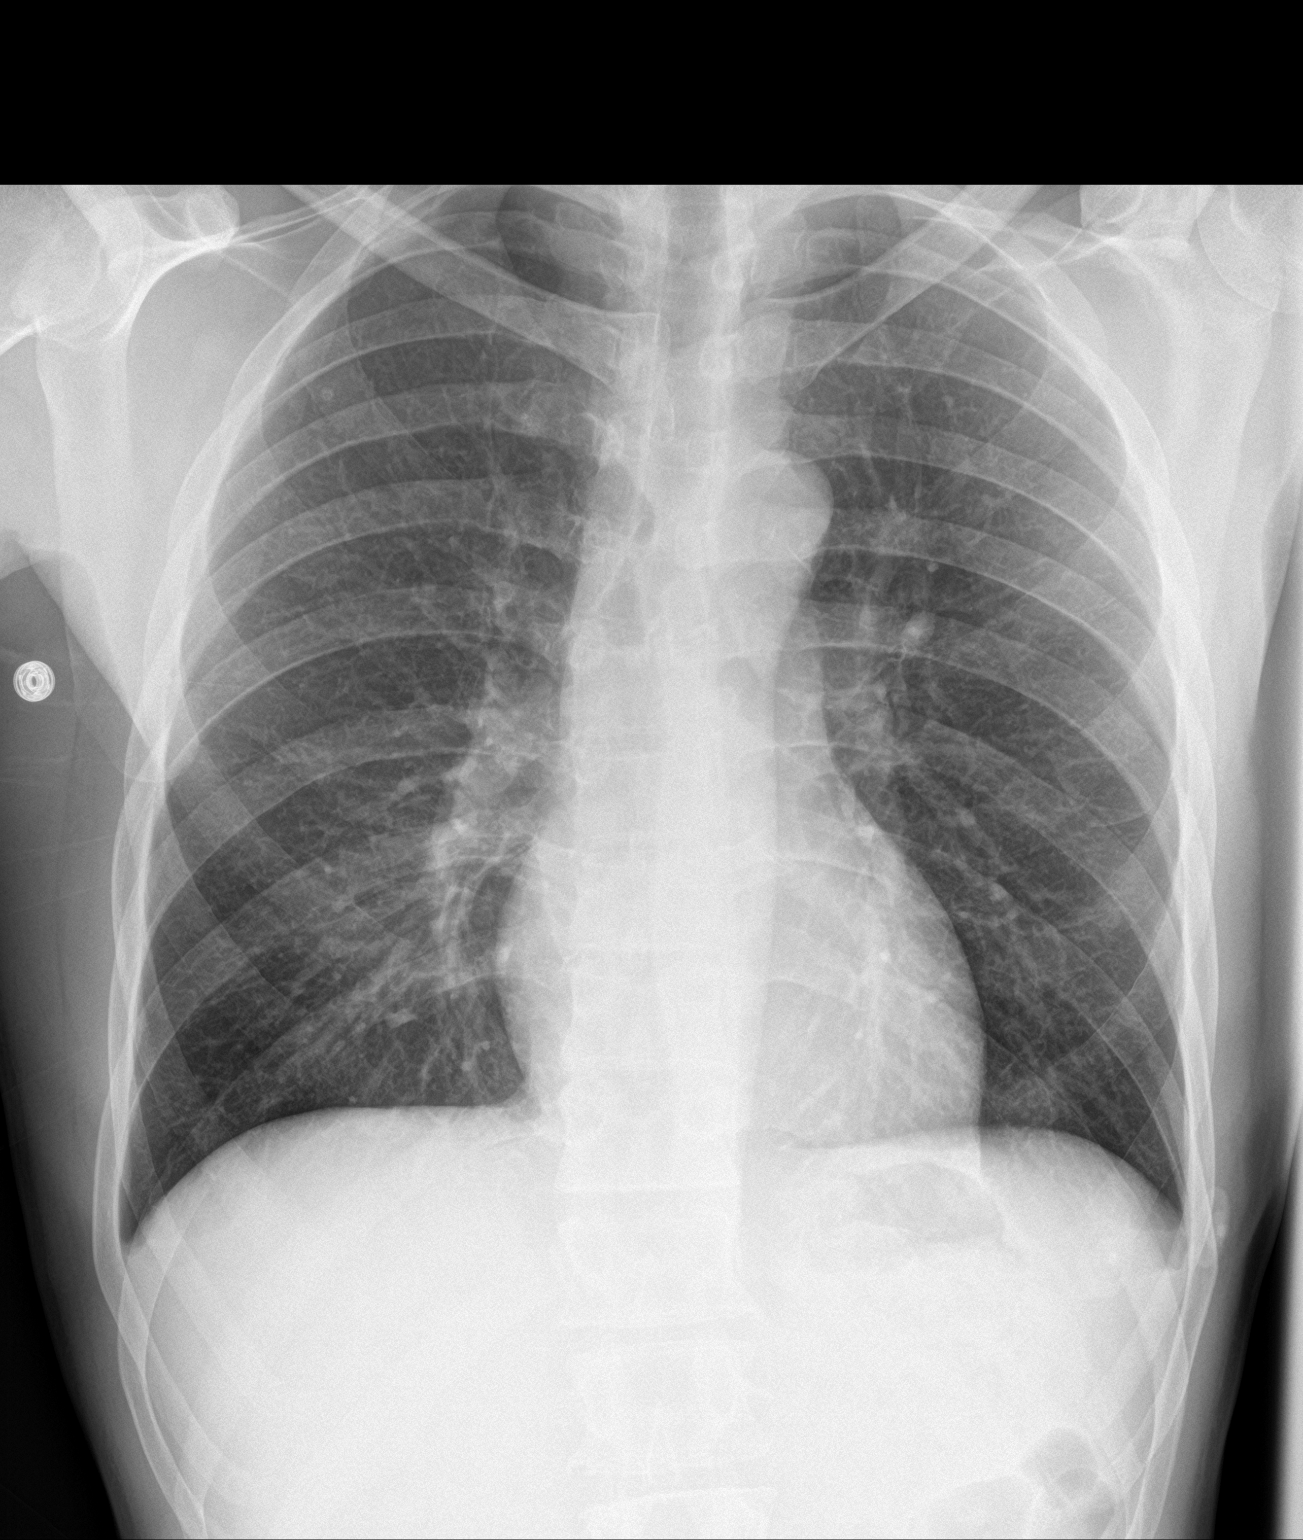

[chest lat]
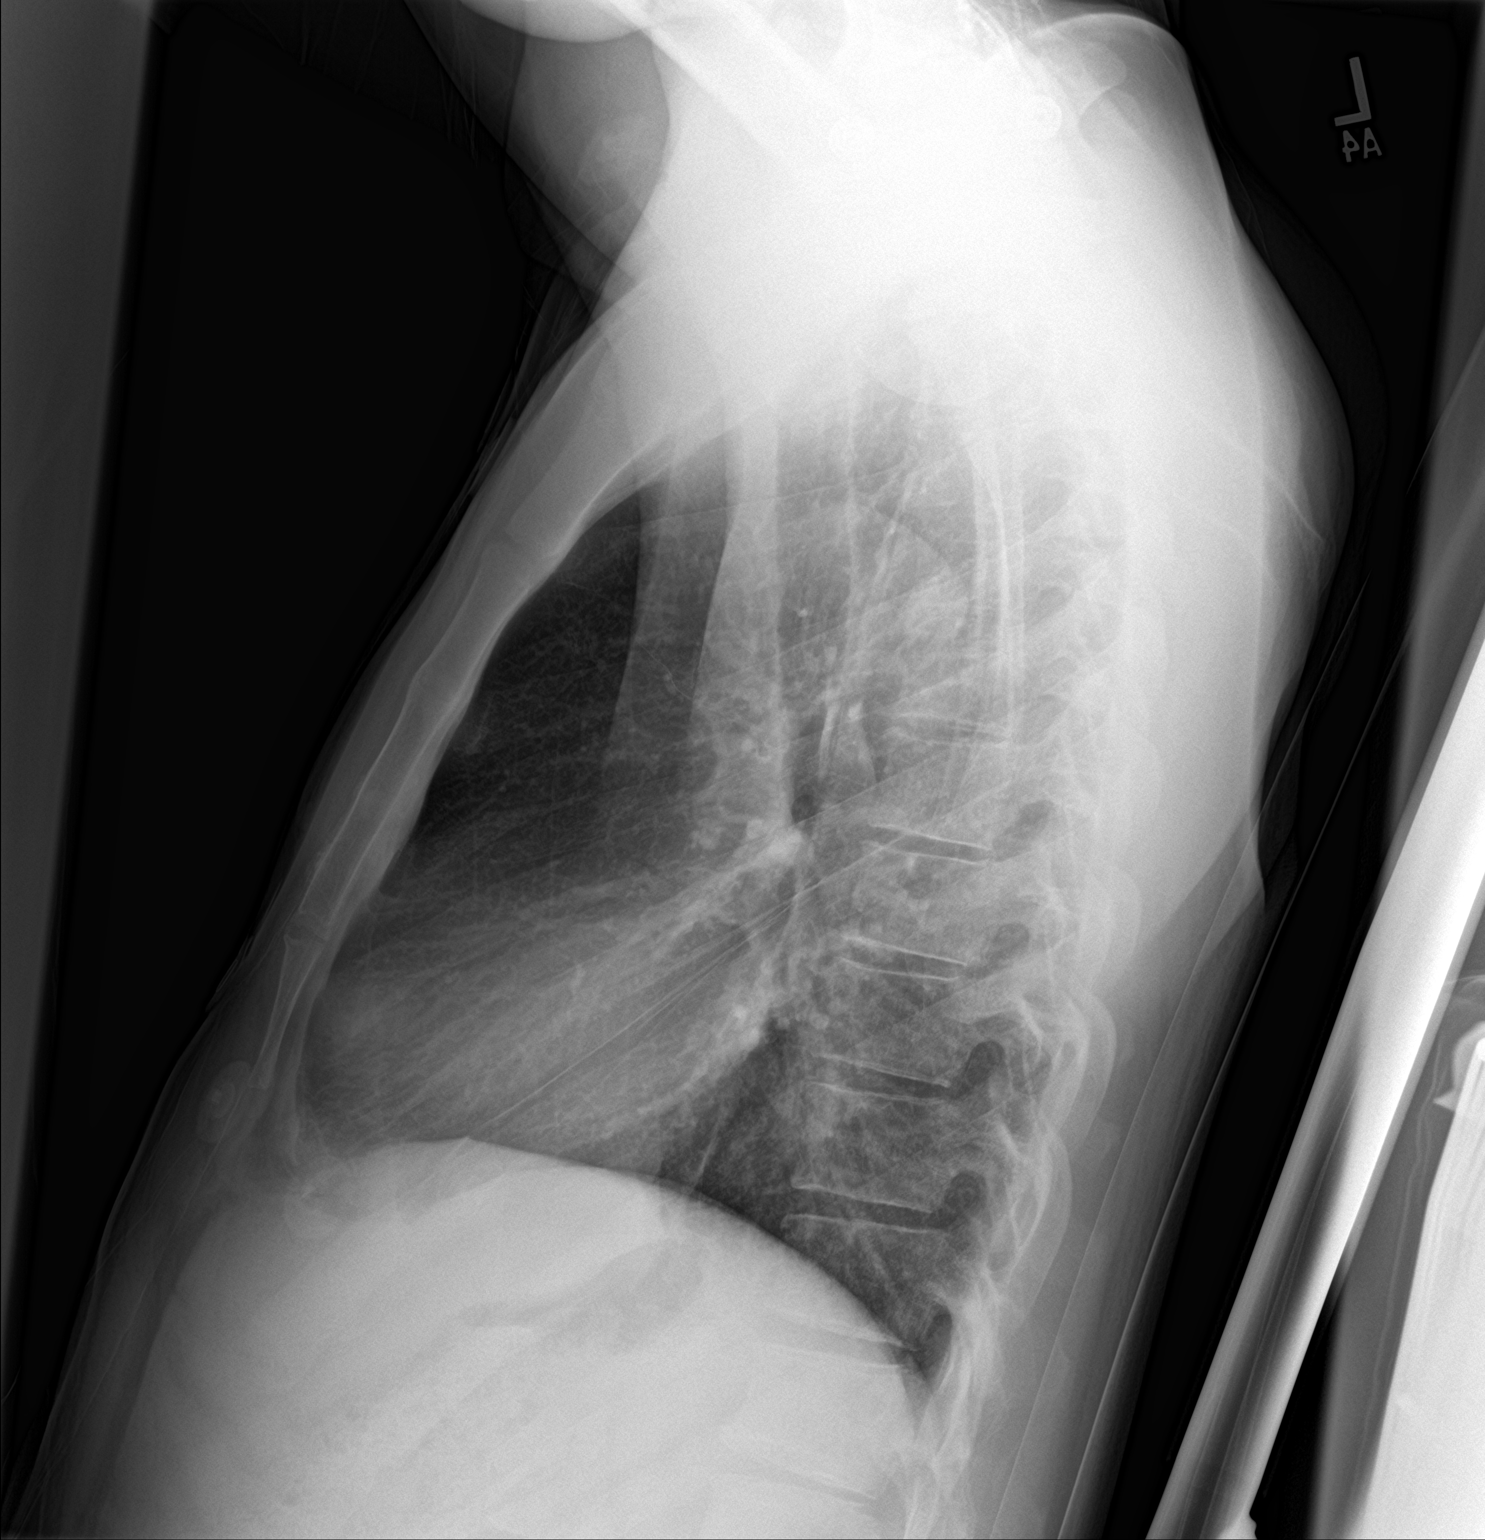

[2 of 2 positions shown; findings below may reference images not displayed]

FINDINGS: The heart size and mediastinal contours are within normal limits.
Both lungs are clear. The visualized skeletal structures are
unremarkable.
IMPRESSION: No active cardiopulmonary disease.

## 2020-01-04 ENCOUNTER — Emergency Department (HOSPITAL_COMMUNITY)
Admission: EM | Admit: 2020-01-04 | Discharge: 2020-01-05 | Disposition: A | Discharge status: Other Acute Inpatient Hospital | Payer: Self-pay | Attending: Emergency Medicine | Admitting: Emergency Medicine

## 2020-01-04 ENCOUNTER — Other Ambulatory Visit: Payer: Self-pay

## 2020-01-04 ENCOUNTER — Encounter (HOSPITAL_COMMUNITY): Payer: Self-pay | Admitting: Emergency Medicine

## 2020-01-04 DIAGNOSIS — Z046 Encounter for general psychiatric examination, requested by authority: Secondary | ICD-10-CM | POA: Insufficient documentation

## 2020-01-04 DIAGNOSIS — Z87891 Personal history of nicotine dependence: Secondary | ICD-10-CM | POA: Insufficient documentation

## 2020-01-04 DIAGNOSIS — R45850 Homicidal ideations: Secondary | ICD-10-CM | POA: Insufficient documentation

## 2020-01-04 DIAGNOSIS — F1924 Other psychoactive substance dependence with psychoactive substance-induced mood disorder: Secondary | ICD-10-CM | POA: Insufficient documentation

## 2020-01-04 DIAGNOSIS — Z20822 Contact with and (suspected) exposure to covid-19: Secondary | ICD-10-CM | POA: Insufficient documentation

## 2020-01-04 DIAGNOSIS — R45851 Suicidal ideations: Secondary | ICD-10-CM | POA: Insufficient documentation

## 2020-01-04 LAB — CBC
HCT: 45.3 % (ref 39.0–52.0)
Hemoglobin: 14.9 g/dL (ref 13.0–17.0)
MCH: 31.6 pg (ref 26.0–34.0)
MCHC: 32.9 g/dL (ref 30.0–36.0)
MCV: 96 fL (ref 80.0–100.0)
Platelets: 375 10*3/uL (ref 150–400)
RBC: 4.72 MIL/uL (ref 4.22–5.81)
RDW: 12.5 % (ref 11.5–15.5)
WBC: 6.6 10*3/uL (ref 4.0–10.5)
nRBC: 0 % (ref 0.0–0.2)

## 2020-01-04 LAB — RAPID URINE DRUG SCREEN, HOSP PERFORMED
Amphetamines: NOT DETECTED
Barbiturates: NOT DETECTED
Benzodiazepines: NOT DETECTED
Cocaine: POSITIVE — AB
Opiates: NOT DETECTED
Tetrahydrocannabinol: POSITIVE — AB

## 2020-01-04 LAB — COMPREHENSIVE METABOLIC PANEL
ALT: 17 U/L (ref 0–44)
AST: 19 U/L (ref 15–41)
Albumin: 3.7 g/dL (ref 3.5–5.0)
Alkaline Phosphatase: 66 U/L (ref 38–126)
Anion gap: 10 (ref 5–15)
BUN: 10 mg/dL (ref 6–20)
CO2: 26 mmol/L (ref 22–32)
Calcium: 9.5 mg/dL (ref 8.9–10.3)
Chloride: 103 mmol/L (ref 98–111)
Creatinine, Ser: 1.02 mg/dL (ref 0.61–1.24)
GFR, Estimated: >60 mL/min (ref >60)
Glucose, Bld: 83 mg/dL (ref 70–99)
Potassium: 3.3 mmol/L — ABNORMAL LOW (ref 3.5–5.1)
Sodium: 139 mmol/L (ref 135–145)
Total Bilirubin: 0.9 mg/dL (ref 0.3–1.2)
Total Protein: 6.9 g/dL (ref 6.5–8.1)

## 2020-01-04 LAB — SALICYLATE LEVEL: Salicylate Lvl: <7 mg/dL — ABNORMAL LOW (ref 7.0–30.0)

## 2020-01-04 LAB — ETHANOL: Alcohol, Ethyl (B): <10 mg/dL (ref <10)

## 2020-01-04 LAB — ACETAMINOPHEN LEVEL: Acetaminophen (Tylenol), Serum: <10 ug/mL — ABNORMAL LOW (ref 10–30)

## 2020-01-04 NOTE — ED Triage Notes (Signed)
Pt here with c/o Si and HI no hx of same , pt states that he has been having thoughts for while just never saw anyone for it

## 2020-01-05 ENCOUNTER — Encounter (HOSPITAL_COMMUNITY): Payer: Self-pay | Admitting: Student

## 2020-01-05 LAB — RESP PANEL BY RT-PCR (FLU A&B, COVID) ARPGX2
Influenza A by PCR: NEGATIVE
Influenza B by PCR: NEGATIVE
SARS Coronavirus 2 by RT PCR: NEGATIVE

## 2020-01-05 MED ORDER — ZOLPIDEM TARTRATE 5 MG PO TABS: 5.0000 mg | ORAL_TABLET | Freq: Every evening | ORAL | Status: DC | PRN

## 2020-01-05 MED ORDER — POTASSIUM CHLORIDE CRYS ER 20 MEQ PO TBCR
40.0000 meq | EXTENDED_RELEASE_TABLET | Freq: Once | ORAL | Status: AC
  Administered 2020-01-05: 40 meq via ORAL
  Filled 2020-01-05: qty 2

## 2020-01-05 MED ORDER — ALUM & MAG HYDROXIDE-SIMETH 200-200-20 MG/5ML PO SUSP: 30.0000 mL | Freq: Four times a day (QID) | ORAL | Status: DC | PRN

## 2020-01-05 MED ORDER — ONDANSETRON HCL 4 MG PO TABS: 4.0000 mg | ORAL_TABLET | Freq: Three times a day (TID) | ORAL | Status: DC | PRN

## 2020-01-05 MED ORDER — NICOTINE 21 MG/24HR TD PT24
21.0000 mg | MEDICATED_PATCH | Freq: Every day | TRANSDERMAL | Status: DC
  Administered 2020-01-05: 21 mg via TRANSDERMAL
  Filled 2020-01-05: qty 1

## 2020-01-05 MED ORDER — ACETAMINOPHEN 325 MG PO TABS
650.0000 mg | ORAL_TABLET | ORAL | Status: DC | PRN
  Administered 2020-01-05: 650 mg via ORAL
  Filled 2020-01-05: qty 2

## 2020-01-05 NOTE — ED Notes (Signed)
ED Provider at bedside. 

## 2020-01-05 NOTE — ED Notes (Signed)
DC instructions reviewed with pt.  Pt verbalized undrstanding. Bus pass provideed. Pt DC

## 2020-01-05 NOTE — ED Notes (Signed)
This RN inventoried all of this pts belongings. Pt wanded by previously assigned staff. All belongings were placed in [address]ocker [address] of [facility]. Pt verbally signed all necessary documentation willingly. Patient oriented to unit and remains calm and cooperative throughout assessment.

## 2020-01-05 NOTE — ED Provider Notes (Signed)
Emergency Medicine Observation Re-evaluation Note  Ralph Daugherty is a 49 y.o. male, seen on rounds today.  Pt initially presented to the ED for complaints of Suicidal Currently, the patient is resting comfortably  Physical Exam  BP (!) 128/93   Pulse 62   Temp 98.2 F (36.8 C) (Oral)   Resp 18   Ht 5\' 11"  (1.803 m)   Wt 80 kg   SpO2 98%   BMI 24.60 kg/m  Physical Exam General: wdwn Cardiac: normal rate Lungs: normal rate Psych: anxious   ED Course / MDM  EKG:    I have reviewed the labs performed to date as well as medications administered while in observation.  Recent changes in the last 24 hours include.  Plan  Current plan is for psych evaluation  Patient is not under full IVC at this time.   , Elson Areas 01/05/20 1403    14/07/21, MD 01/06/20 [phone]

## 2020-01-05 NOTE — BH Assessment (Addendum)
Assessment Note  Ralph Daugherty is an 49 y.o. male. Patient presents to [facility], voluntarily. He is a self referral and was brought to the ED by his girlfriend, Alda Ponder [phone]. States that he binges on alcohol. Also, drugs: cocaine and THC. His substance use is mainly on the weekends and has been on-going for 10 yrs. Says that he works during the week days so it's hard for him to use. Patient had a alcohol and drug binge this past weekend. He drank a 12 pack of beer. Used "a lot of cocaine" and smoke several months. His last use of all substances was 01/04/2020. Patient says that he does not have much recollection of the events that occurred over the past several days due to his alcohol drug use. He was told that he was behaving erratically running up and down [employer] in [place], [place]. States that he also gave his girlfriends car away and has no idea where the car is to date. He has not filed a stolen car report for this incident.    Patient realizing that his girlfriend would be mad about him over the car situation started to feel suicidal. He is also thinking about his son being in prison for #2 murder charges. Last night he thought about jumping from the tallest building downtown. Denied intent to go forward with his suicidal ideations. He states that he knows he doesn't have access to get to the top of the building and would need special keys. He also states, "That would be messy and don't really want to do that to myself". He denies prior suicide attempts/gestures. Depressive symptoms include: Feeling angry/irritable, Feeling worthless/self pity, Loss of interest in usual pleasures, Guilt, Fatigue, Isolating, Tearfulness, Insomnia. Denies anxiety symptoms. Appetite is good. States, he sleeps 4-5 hrs per night. Denies history of trauma. Support system is his girlfriend. Denies HI. Denies history of aggressive and assaultive behaviors. He does have a  hx of criminal charges and has been out  of prison for 9 yrs. He reports auditory hallucinations telling his to "Stop using drugs". Denies visual hallucinations. He has a history of inpatient residential treatment at [facility] in [facility]. He does not have any outpatient psychiatric providers at this time.     Diagnosis: Substance Induced Mood Disorder, Depressive Disorder, and Substance Use Disorder      Past Medical History: History reviewed. No pertinent past medical history.  Past Surgical History:  Procedure Laterality Date  . CERVICAL FUSION      Family History: History reviewed. No pertinent family history.  Social History:  reports that he quit smoking about 23 years ago. He has never used smokeless tobacco. He reports current alcohol use. He reports current drug use. Drug: Marijuana.  Additional Social History:  Alcohol / Drug Use Pain Medications: SEE MAR Prescriptions: SEE MAR Over the Counter: SEE MAR History of alcohol / drug use?: Yes Negative Consequences of Use: Personal relationships Substance #1 Name of Substance 1: Cocaine 1 - Age of First Use: 49 yrs old 1 - Amount (size/oz): 1 gram 1 - Frequency: "When I feel like I'm about to crazy"; 1 time every two weeks to 3 times per week" 1 - Duration: 10 yrs 1 - Last Use / Amount: 01/04/2020 Substance #2 Name of Substance 2: THC 2 - Age of First Use: 49 yrs old 2 - Amount (size/oz): 01/04/20; "8th...that's like 3-4 blunts" 2 - Frequency: "I use on the weekends...typically Saturday or Sunday" 2 - Duration: on-going 2 -  Last Use / Amount: 01/04/20; "8th...that's like 3-4 blunts" Substance #3 Name of Substance 3: Alcohol 3 - Age of First Use: 3 or 49 yrs old 3 - Amount (size/oz): "I am a weekend drinker....maybe a 12 pack over the course of the weekend" 3 - Frequency: weekends 3 - Duration: on-going 3 - Last Use / Amount: 01/04/20  CIWA: CIWA-Ar BP: 121/75 Pulse Rate: 71 COWS:    Allergies:  Allergies  Allergen Reactions  . Penicillins Anaphylaxis     Presented 05/23/16 after taking penicillin with hoarse voice and uvula swelling, improved with epinephrine - update: 06/11/16 - pt states it was not penicillin - he forgot he had cookies with walnuts in them and thought it was penicillin b/c he'd just started that medicine and found out the cookies had walnuts in them after he got home.     Home Medications: (Not in a hospital admission)   OB/GYN Status:  No LMP for male patient.  General Assessment Data Location of Assessment:  ([facility]) TTS Assessment: In system Is this a Tele or Face-to-Face Assessment?: Tele Assessment Is this an Initial Assessment or a Re-assessment for this encounter?: Initial Assessment Patient Accompanied by::  (brought by girlfriend ) Language Other than English: No Living Arrangements: Other (Comment) (with girlfriend and her son ) What gender do you identify as?: Male Marital status: Single Maiden name:  (n/a) Pregnancy Status: No Living Arrangements: Spouse/significant other, Non-relatives/Friends (witih girlfriend and her son ) Can pt return to current living arrangement?: Yes Admission Status: Voluntary Is patient capable of signing voluntary admission?: Yes Referral Source: Self/Family/Friend     Crisis Care Plan Living Arrangements: Spouse/significant other, Non-relatives/Friends (witih girlfriend and her son ) Legal Guardian: Other: (no guardian ) Name of Psychiatrist:  (no psychiatrist ) Name of Therapist:  (no therapist )  Education Status Is patient currently in school?: No Is the patient employed, unemployed or receiving disability?: Employed  Risk to self with the past 6 months Suicidal Ideation: No-Not Currently/Within Last 6 Months Has patient been a risk to self within the past 6 months prior to admission? : Yes Suicidal Intent: No-Not Currently/Within Last 6 Months Has patient had any suicidal intent within the past 6 months prior to admission? : No Is patient at risk for suicide?:  No Suicidal Plan?:  ("I wanted to go to the top of building and jump off") Has patient had any suicidal plan within the past 6 months prior to admission? : Yes Access to Means: Yes Specify Access to Suicidal Means:  ("I was thinking about a buidling but cant' get to the top") What has been your use of drugs/alcohol within the last 12 months?:  (cocaine, thc, alcohol ) Previous Attempts/Gestures: No How many times?:  (0) Other Self Harm Risks:  (denies ) Triggers for Past Attempts: Other (Comment) (denies prior attempts ) Intentional Self Injurious Behavior: None Family Suicide History: Yes ("I dont' the name of what it was but was my mom") Recent stressful life event(s): Other (Comment) (conflict with g-friend and son in jail for #2 murders ) Persecutory voices/beliefs?: Yes Depression: Yes Depression Symptoms: Feeling angry/irritable, Feeling worthless/self pity, Loss of interest in usual pleasures, Guilt, Fatigue, Isolating, Tearfulness, Insomnia Substance abuse history and/or treatment for substance abuse?: No Suicide prevention information given to non-admitted patients: Not applicable  Risk to Others within the past 6 months Homicidal Ideation: No-Not Currently/Within Last 6 Months Does patient have any lifetime risk of violence toward others beyond the six months prior to admission? :  No Thoughts of Harm to Others: No Current Homicidal Intent: No Current Homicidal Plan: No Access to Homicidal Means: No Identified Victim:  (n/a) History of harm to others?: No Assessment of Violence: None Noted Violent Behavior Description:  (currently calm and coopertive ) Does patient have access to weapons?: Yes (Comment) Criminal Charges Pending?: No Describe Pending Criminal Charges:  (hx of criminal charges; out of prison for 9 yrs ) Does patient have a court date: No Is patient on probation?: No  Psychosis Hallucinations: Auditory ("Sometimes...last heard voices yesterday") Delusions:   (Auditory-"Stop doing it")  Mental Status Report Appearance/Hygiene: In scrubs Eye Contact: Good Motor Activity: Hyperactivity Speech: Logical/coherent Level of Consciousness: Alert Mood: Depressed Affect: Appropriate to circumstance Anxiety Level: None Thought Processes: Relevant, Coherent Judgement: Impaired Orientation: Person, Place, Time, Situation Obsessive Compulsive Thoughts/Behaviors: None  Cognitive Functioning Concentration: Decreased Memory: Recent Intact, Remote Intact Is patient IDD: No Insight: Fair Impulse Control: Poor Appetite: Good ("When I come off drugs I have a big appetite") Have you had any weight changes? : No Change Sleep: Decreased Total Hours of Sleep:  (4-5 hrs ) Vegetative Symptoms: None  ADLScreening [facility] Assessment Services) Patient's cognitive ability adequate to safely complete daily activities?: Yes Patient able to express need for assistance with ADLs?: Yes Independently performs ADLs?: Yes (appropriate for developmental age)  Prior Inpatient Therapy Prior Inpatient Therapy: Yes Prior Therapy Dates:  ([facility]-"yrs ago"...."1997") Prior Therapy Facilty/Provider(s):  ([facility] ) Reason for Treatment:  (substance abuse )  Prior Outpatient Therapy Prior Outpatient Therapy: No Does patient have an ACCT team?: No Does patient have Intensive In-House Services?  : No Does patient have [facility] services? : No Does patient have P4CC services?: No  ADL Screening (condition at time of admission) Patient's cognitive ability adequate to safely complete daily activities?: Yes Patient able to express need for assistance with ADLs?: Yes Independently performs ADLs?: Yes (appropriate for developmental age)       Abuse/Neglect Assessment (Assessment to be complete while patient is alone) Physical Abuse: Denies Verbal Abuse: Denies Sexual Abuse: Denies Exploitation of patient/patient's resources: Denies Self-Neglect: Denies                 Disposition: Per Marciano Sequin, NP, discharge with residential treatment recommendations. Ladoris Gene, LCSW will add residential substance use referrals to AVS. Charge nurse notified of discharge plans.  Disposition Initial Assessment Completed for this Encounter: Yes  On Site Evaluation by:   Reviewed with Physician:    Melynda Ripple 01/05/2020 5:24 PM

## 2020-01-05 NOTE — ED Provider Notes (Signed)
[facility] EMERGENCY DEPARTMENT Provider Note   CSN: [id number] Arrival date & time: 01/04/20  1235     History Chief Complaint  Patient presents with  . Suicidal    Ralph Daugherty is a 49 y.o. male who presents to the ED for evaluation of SI/HI. Patient states he has had longstanding mental health problems, saw a counselor/therapist as a child, however has not in a very long time.  He states he has been in and out of the prison system and has had a lot of increased stress.  He states that he frequently has thoughts of suicide without specific plan as well as thoughts of homicide with plan to beat a group of individuals.  No specific alleviating or aggravating factors.  He reports that he drinks alcohol, but not daily, no history of alcohol withdrawal symptoms.  He denies hallucinations.  HPI     No past medical history on file.  There are no problems to display for this patient.   Past Surgical History:  Procedure Laterality Date  . CERVICAL FUSION         No family history on file.  Social History   Tobacco Use  . Smoking status: Former Smoker    Quit date: 1998    Years since quitting: 23.9  . Smokeless tobacco: Never Used  Vaping Use  . Vaping Use: Every day  Substance Use Topics  . Alcohol use: Yes    Comment: 40 oz every day  . Drug use: Yes    Types: Marijuana    Home Medications Prior to Admission medications   Medication Sig Start Date End Date Taking? Authorizing Provider  clindamycin (CLEOCIN) 150 MG capsule Take 2 capsules (300 mg total) by mouth 3 (three) times daily. 08/25/18   Lawyer, Cristal Deer, PA-C  ibuprofen (ADVIL) 800 MG tablet Take 1 tablet (800 mg total) by mouth every 8 (eight) hours as needed. 08/25/18   Lawyer, Cristal Deer, PA-C  naproxen (NAPROSYN) 500 MG tablet Take 1 tablet (500 mg total) by mouth 2 (two) times daily. 01/21/18   Khatri, Hina, PA-C  traMADol (ULTRAM) 50 MG tablet Take 1 tablet (50 mg total) by  mouth every 6 (six) hours as needed for severe pain. 08/25/18   Lawyer, Cristal Deer, PA-C    Allergies    Penicillins  Review of Systems   Review of Systems  Constitutional: Negative for chills and fever.  Respiratory: Negative for shortness of breath.   Cardiovascular: Negative for chest pain.  Gastrointestinal: Negative for abdominal pain.  Neurological: Negative for syncope.  Psychiatric/Behavioral: Positive for suicidal ideas.       Positive for HI  All other systems reviewed and are negative.   Physical Exam Updated Vital Signs BP 131/88 (BP Location: Right Arm)   Pulse 62   Temp 98.7 F (37.1 C) (Oral)   Resp 15   Ht 5\' 11"  (1.803 m)   Wt 80 kg   SpO2 99%   BMI 24.60 kg/m   Physical Exam Vitals and nursing note reviewed.  Constitutional:      General: He is not in acute distress.    Appearance: He is well-developed. He is not toxic-appearing.  HENT:     Head: Normocephalic and atraumatic.  Eyes:     General:        Right eye: No discharge.        Left eye: No discharge.     Conjunctiva/sclera: Conjunctivae normal.  Cardiovascular:     Rate  and Rhythm: Normal rate and regular rhythm.  Pulmonary:     Effort: Pulmonary effort is normal. No respiratory distress.     Breath sounds: Normal breath sounds. No wheezing, rhonchi or rales.  Abdominal:     General: There is no distension.     Palpations: Abdomen is soft.     Tenderness: There is no abdominal tenderness. There is no guarding or rebound.  Musculoskeletal:     Cervical back: Neck supple.  Skin:    General: Skin is warm and dry.     Findings: No rash.  Neurological:     Mental Status: He is alert.     Comments: Clear speech.   Psychiatric:        Behavior: Behavior is cooperative.        Thought Content: Thought content includes homicidal and suicidal ideation.     ED Results / Procedures / Treatments   Labs (all labs ordered are listed, but only abnormal results are displayed) Labs Reviewed   COMPREHENSIVE METABOLIC PANEL - Abnormal; Notable for the following components:      Result Value   Potassium 3.3 (*)    All other components within normal limits  SALICYLATE LEVEL - Abnormal; Notable for the following components:   Salicylate Lvl <7.0 (*)    All other components within normal limits  ACETAMINOPHEN LEVEL - Abnormal; Notable for the following components:   Acetaminophen (Tylenol), Serum <10 (*)    All other components within normal limits  RAPID URINE DRUG SCREEN, HOSP PERFORMED - Abnormal; Notable for the following components:   Cocaine POSITIVE (*)    Tetrahydrocannabinol POSITIVE (*)    All other components within normal limits  ETHANOL  CBC    EKG None  Radiology No results found.  Procedures Procedures (including critical care time)  Medications Ordered in ED Medications - No data to display  ED Course  I have reviewed the triage vital signs and the nursing notes.  Pertinent labs & imaging results that were available during my care of the patient were reviewed by me and considered in my medical decision making (see chart for details).    MDM Rules/Calculators/A&P                          Patient presents to the ED for evaluation of SI/HI.  Nontoxic, vitals grossly unremarkable.   Additional history obtained:  Additional history obtained from nursing note/chart review.  Lab Tests:  I reviewed and interpreted labs, which included:  CBC, CMP, ethanol, salicylate level, acetaminophen level, UDS- mild hypokalemia- will orally replace. UDS positive for cocaine & tetrahydrocannabinol.   Patient is medically cleared.  Consult placed to TTS.  Disposition per behavioral health.  The patient has been placed in psychiatric observation due to the need to provide a safe environment for the patient while obtaining psychiatric consultation and evaluation, as well as ongoing medical and medication management to treat the patient's condition.  The patient has  not been placed under full IVC at this time.  Portions of this note were generated with Scientist, clinical (histocompatibility and immunogenetics). Dictation errors may occur despite best attempts at proofreading.  Final Clinical Impression(s) / ED Diagnoses Final diagnoses:  None    Rx / DC Orders ED Discharge Orders    None       Cherly Anderson, PA-C 01/11/20 1632    Nira Conn, MD 01/13/20 [phone]

## 2020-01-05 NOTE — Discharge Instructions (Addendum)
Residential Treatment  Facilities Medicaid Detox No Insurance Engineer, site ([facility]) [address] [place], [place] [phone] or  [phone]   No  Yes  Yes  Yes    [facility] [phone] [address] [place], [place] [address] [phone] Admissions: 8am-3pm  M-F   [place] only  No  Yes  No    [facility] [phone]   No  Yes  No- out of pocket 16,000  Yes   [facility] ([facility]) [address] [place], [place] [phone]   Yes- No medicare  Yes   Yes, [place]   [address] only    [address] [place], [place], [address] [phone]            No  No  Yes but private pay, offers some sponsorships  Does not take insurance   Residential Treatment  Facilities Medicaid Detox No Insurance Private Insurance   [facility]  [facility] [place], [place] [phone] (takes everyone as long as they meet detox criteria)   Yes  Yes   Yes  Yes   [address] [place], [place]  [phone] 27 locations    No  No- sober living house  90.00-130.00 per week per person  [facility] Part of [place] Outreach [phone] [email or web address] .[facility] Part of [place] Outreach [phone] [email or web address]    No   [facility] [address]  [place] [phone] [email or web address]   No  No  1,200.00 a 200.00 deposit is required at start of treatment Payment plans accepted [employer] [facility] based program  No   [facility] [address]  [place], [place], [address] [phone]        No  Yes       Yes  Regular Rehab: 7,500.00 28 days Dual Diagnosis: 8,900.00 28 days 7 day detox: 2.700.00 or 3,400.00 for Dual Diagnosis     Yes   Residential Treatment  Facilities *out of state     Medicaid  Detox  No Insurance  Private Insurance   [facility] *out of state facilities [phone]   No  Yes  Private Pay  Yes    [facility] *out of state facilities  [phone]   No  Yes  Private Pay   Yes                  Outpatient Treatment  Facilities Medicaid Detox No Insurance Private  Insurance   [facility] [address]  [place], [place], [address] [phone]   No  No  No  Yes    [facility]  (If substance abuse is secondary diagnosis) [address],  [place], [place] [address] [phone]   Yes-[place] Only for Partial   No   No   Yes- IOP   [facility] [address]  [place], [place], [address] [phone]   Yes  They would go to ER at [facility] then be transferred to a detox unit    Yes- self pay     Yes    [facility]  [address]  [place], [place], [address] And  [address],  [place], [place] [address] [phone]           Yes  No    Yes most qualify for state funding  IOP and  Opiod treatment- Offers Methadone    [employer], [facility], [place]  Outpatient Treatment  Facilities Medicaid Detox No [employer]  [facility] [address] [place], [place], [address] [phone] (suboxone)  YES No Private Pay Medicaid only   [facility] [address] [place], [place],  [phone]   Yes but not for suboxone treatment  Yes- opiates with suboxone have to commit to 8 week IOP   Yes 595.00 for first visit  150.00 for prescription 150.00 a week after that for group    Yes    [facility] [address]  [place], [place] [phone]  No- has a waiting list about to be approved No Yes- but has to be self pay  500.00 for 1st 2 weeks  500 for next 2 weeks and  750 mo.  Ongoing Yes    [facility] [phone]  [address] [place], [place] [phone]  No No Limited sponsorships IOP- 9,500.00 8-15 weeks If paid upfront gives a 500.00 deduction Outpatient- 1 day a week  9 weeks 4,500.00 has payment plans   Yes- out of network though   [facility] (Groups/Residential) [place], [place]  [phone]  Yes- [place] No IOP facility  Yes  No  Outpatient Treatment  Facilities Medicaid Detox No [facility]   [facility]  [address]  [place], [place] [address] [phone]   Yes   No    Yes     Mobile Crisis: [facility]: [phone]  (For crisis response 24 hours a day) [facility] Hotline: ([phone]

## 2020-01-05 NOTE — ED Notes (Signed)
Lunch Tray Ordered @ 1036. 

## 2020-02-02 ENCOUNTER — Emergency Department (HOSPITAL_COMMUNITY)
Admission: EM | Admit: 2020-02-02 | Discharge: 2020-02-02 | Disposition: A | Discharge status: Home / Self Care | Payer: HRSA Program | Attending: Emergency Medicine | Admitting: Emergency Medicine

## 2020-02-02 ENCOUNTER — Encounter (HOSPITAL_COMMUNITY): Payer: Self-pay

## 2020-02-02 ENCOUNTER — Other Ambulatory Visit: Payer: Self-pay

## 2020-02-02 DIAGNOSIS — U071 COVID-19: Secondary | ICD-10-CM | POA: Diagnosis not present

## 2020-02-02 DIAGNOSIS — Z87891 Personal history of nicotine dependence: Secondary | ICD-10-CM | POA: Diagnosis not present

## 2020-02-02 DIAGNOSIS — R059 Cough, unspecified: Secondary | ICD-10-CM | POA: Diagnosis present

## 2020-02-02 LAB — RESP PANEL BY RT-PCR (FLU A&B, COVID) ARPGX2
Influenza A by PCR: NEGATIVE
Influenza B by PCR: NEGATIVE
SARS Coronavirus 2 by RT PCR: POSITIVE — AB

## 2020-02-02 MED ORDER — ACETAMINOPHEN 325 MG PO TABS
650.0000 mg | ORAL_TABLET | Freq: Once | ORAL | Status: AC
  Administered 2020-02-02: 650 mg via ORAL
  Filled 2020-02-02: qty 2

## 2020-02-02 MED ORDER — BENZONATATE 100 MG PO CAPS: 100.0000 mg | ORAL_CAPSULE | Freq: Three times a day (TID) | ORAL | 0 refills | Status: DC | PRN

## 2020-02-02 MED ORDER — BENZONATATE 100 MG PO CAPS
100.0000 mg | ORAL_CAPSULE | Freq: Once | ORAL | Status: AC
  Administered 2020-02-02: 100 mg via ORAL
  Filled 2020-02-02: qty 1

## 2020-02-02 NOTE — Discharge Instructions (Addendum)
You came to the emergency department today with reports of Covid-19 like symptoms.   You tested positive for COVID-19. Please isolate at home for at least 5 days after the day your symptoms initially began, and THEN at least 24 hours after you are fever-free without the help of medications (Tylenol/acetaminophen and Advil/ibuprofen/Motrin) AND your symptoms are improving.  You can alternate Tylenol/acetaminophen and Advil/ibuprofen/Motrin every 4 hours for sore throat, body aches, headache or fever. Do not take more than 3,000 mg tylenol in a 24 hour period (not more than one dose every 8 hours.  Please check all medication labels as many medications such as pain and cold medications may contain tylenol.  Do not drink alcohol while taking these medications.  Do not take other NSAID'S while taking ibuprofen (such as aleve or naproxen).  Please take ibuprofen with food to decrease stomach upset.  Drink plenty of water.  Use saline nasal spray for congestion. You can take Tessalon every 8 hours as needed for cough.  You can also try a spoonful of honey to help alleviate your cough as well. Wash your hands frequently. Please rest as needed with frequent repositioning and ambulation as tolerated.    If you use a CPAP or BiPAP device for management of obstructive sleep apnea may continue to use it however use it when isolated from other individuals to avoid spread of COVID-19.   If you use a nebulizer administer medication such as albuterol you may continue to use it however only one isolated from other individuals to avoid the spread of COVID-19.  If your symptoms do not improve please follow-up with your primary care provider or urgent care.  Return to the ER for significant shortness of breath, uncontrollable vomiting, severe chest pain, inability to tolerate fluids, changes in mental status such as confusion or other concerning symptoms.

## 2020-02-02 NOTE — ED Triage Notes (Addendum)
Pt presents with all over body aches and "feeling bad" starting this am. Unvaccinated

## 2020-02-02 NOTE — ED Notes (Signed)
Discharge paperwork discussed with pt. Pt agreeable to discharge. Esignature pad not available.

## 2020-02-02 NOTE — ED Provider Notes (Signed)
[facility] EMERGENCY DEPARTMENT Provider Note   CSN: [id number] Arrival date & time: 02/02/20  1242     History Chief Complaint  Patient presents with  . Generalized Body Aches    Ralph Daugherty is a 50 y.o. male with no previous medical history.  Patient presents with chief complaint of myalgias and productive cough.  He reports that his symptoms began this morning.  Patient denies COVID-19 or influenza vaccine.  Patient denies any shortness of breath, difficulty breathing, chest pain, nausea, vomiting.    HPI     History reviewed. No pertinent past medical history.  There are no problems to display for this patient.   Past Surgical History:  Procedure Laterality Date  . CERVICAL FUSION         No family history on file.  Social History   Tobacco Use  . Smoking status: Former Smoker    Quit date: 1998    Years since quitting: 24.0  . Smokeless tobacco: Never Used  Vaping Use  . Vaping Use: Every day  Substance Use Topics  . Alcohol use: Yes    Comment: 40 oz every day  . Drug use: Yes    Types: Marijuana    Home Medications Prior to Admission medications   Medication Sig Start Date End Date Taking? Authorizing Provider  benzonatate (TESSALON) 100 MG capsule Take 1 capsule (100 mg total) by mouth every 8 (eight) hours as needed for cough. 02/02/20  Yes Haskel Schroeder, PA-C  acetaminophen (TYLENOL) 325 MG tablet Take 650 mg by mouth every 6 (six) hours as needed for mild pain or headache.    [provider]  Multiple Vitamin (MULTIVITAMIN ADULT PO) Take 1 tablet by mouth daily.    [provider]    Allergies    Penicillins  Review of Systems   Review of Systems  Constitutional: Negative for chills and fever.  HENT: Negative for sore throat.   Eyes: Negative for visual disturbance.  Respiratory: Positive for cough (productive) and shortness of breath. Negative for wheezing and stridor.   Cardiovascular:  Negative for chest pain.  Gastrointestinal: Negative for abdominal pain, nausea and vomiting.  Genitourinary: Negative for dysuria.  Musculoskeletal: Negative for back pain and neck pain.  Skin: Negative for color change and rash.  Neurological: Negative for dizziness, syncope, light-headedness and headaches.  Psychiatric/Behavioral: Negative for confusion.    Physical Exam Updated Vital Signs BP 123/79 (BP Location: Right Arm)   Pulse 78   Temp 100.3 F (37.9 C) (Oral)   Resp 15   Ht 5\' 11"  (1.803 m)   Wt 79.8 kg   SpO2 96%   BMI 24.55 kg/m   Physical Exam Vitals and nursing note reviewed.  Constitutional:      General: He is not in acute distress.    Appearance: He is normal weight. He is not ill-appearing, toxic-appearing or diaphoretic.  HENT:     Head: Normocephalic and atraumatic.  Eyes:     General: No scleral icterus.       Right eye: No discharge.        Left eye: No discharge.  Cardiovascular:     Rate and Rhythm: Normal rate and regular rhythm.     Heart sounds: Normal heart sounds.  Pulmonary:     Effort: Pulmonary effort is normal. No tachypnea, bradypnea, accessory muscle usage, prolonged expiration or respiratory distress.     Breath sounds: No stridor. No wheezing, rhonchi or rales.  Abdominal:  General: Abdomen is flat. There is no distension.     Palpations: Abdomen is soft.  Musculoskeletal:     Cervical back: Neck supple.  Skin:    General: Skin is warm and dry.  Neurological:     General: No focal deficit present.     Mental Status: He is alert.  Psychiatric:        Behavior: Behavior is cooperative.     ED Results / Procedures / Treatments   Labs (all labs ordered are listed, but only abnormal results are displayed) Labs Reviewed  RESP PANEL BY RT-PCR (FLU A&B, COVID) ARPGX2 - Abnormal; Notable for the following components:      Result Value   SARS Coronavirus 2 by RT PCR POSITIVE (*)    All other components within normal limits     EKG None  Radiology No results found.  Procedures Procedures (including critical care time)  Medications Ordered in ED Medications  acetaminophen (TYLENOL) tablet 650 mg (650 mg Oral Given 02/02/20 1643)  benzonatate (TESSALON) capsule 100 mg (100 mg Oral Given 02/02/20 1643)    ED Course  I have reviewed the triage vital signs and the nursing notes.  Pertinent labs & imaging results that were available during my care of the patient were reviewed by me and considered in my medical decision making (see chart for details).    MDM Rules/Calculators/A&P                          Alert 50 year old male, in no acute distress, nontoxic appearing.  Presents with chief complaint of myalgia and productive cough that began this morning.  Patient denies receiving COVID-19 or influenza vaccine.  Patient denies any medical history.  Patient is in no acute respiratory distress, sitting upright, speaking in full complete sentences without difficulty.  SPO2 96% on room air.  Lungs clear to auscultation bilaterally.  COVID-19 PCR test positive.  Patient was given Tylenol and Tessalon while in ED.  Discussed results, findings, treatment and follow up. Patient advised of return precautions. Gave patient information on self-isolation provided him with a work note.  Patient prescribed Tessalon for his cough.  Patient verbalized understanding and agreed with plan.  Ralph Daugherty was evaluated in Emergency Department on 02/03/2020 for the symptoms described in the history of present illness. He was evaluated in the context of the global COVID-19 pandemic, which necessitated consideration that the patient might be at risk for infection with the SARS-CoV-2 virus that causes COVID-19. Institutional protocols and algorithms that pertain to the evaluation of patients at risk for COVID-19 are in a state of rapid change based on information released by regulatory bodies including the CDC and federal and state  organizations. These policies and algorithms were followed during the patient's care in the ED.   Final Clinical Impression(s) / ED Diagnoses Final diagnoses:  COVID-19    Rx / DC Orders ED Discharge Orders         Ordered    benzonatate (TESSALON) 100 MG capsule  Every 8 hours PRN        02/02/20 [address] PA-C 02/03/20 0015    Gwyneth Sprout, MD 02/03/20 1535

## 2020-04-07 ENCOUNTER — Encounter (HOSPITAL_COMMUNITY): Payer: Self-pay | Admitting: *Deleted

## 2020-04-07 ENCOUNTER — Emergency Department (HOSPITAL_COMMUNITY)
Admission: EM | Admit: 2020-04-07 | Discharge: 2020-04-07 | Disposition: A | Discharge status: Home / Self Care | Payer: Self-pay | Attending: Emergency Medicine | Admitting: Emergency Medicine

## 2020-04-07 ENCOUNTER — Other Ambulatory Visit: Payer: Self-pay

## 2020-04-07 DIAGNOSIS — Z87891 Personal history of nicotine dependence: Secondary | ICD-10-CM | POA: Insufficient documentation

## 2020-04-07 DIAGNOSIS — H9201 Otalgia, right ear: Secondary | ICD-10-CM | POA: Insufficient documentation

## 2020-04-07 MED ORDER — CIPROFLOXACIN-DEXAMETHASONE 0.3-0.1 % OT SUSP
4.0000 [drp] | Freq: Once | OTIC | Status: DC
  Filled 2020-04-07: qty 7.5

## 2020-04-07 MED ORDER — NAPROXEN 500 MG PO TABS: 500.0000 mg | ORAL_TABLET | Freq: Two times a day (BID) | ORAL | 0 refills | Status: DC

## 2020-04-07 NOTE — ED Provider Notes (Signed)
[facility] EMERGENCY DEPARTMENT Provider Note   CSN: [id number] Arrival date & time: 04/07/20  3546     History Chief Complaint  Patient presents with  . Otalgia    Ralph Daugherty is a 50 y.o. male.  Patient presents the emergency department for evaluation of right ear pain.  Patient states that he developed pain about 3 days ago.  No injuries.  Pain feels like it is "internal".  He feels as though the area in front of his ear is swollen.  He denies dental pain or mouth pain.  He states that he applied a cotton ball in the other night without improvement.  Denies other treatments.  He was concerned that infection might spread to his brain.  He denies fevers, runny nose, sore throat.  The onset of this condition was acute. The course is constant. Aggravating factors: none. Alleviating factors: none.          History reviewed. No pertinent past medical history.  There are no problems to display for this patient.   Past Surgical History:  Procedure Laterality Date  . CERVICAL FUSION         No family history on file.  Social History   Tobacco Use  . Smoking status: Former Smoker    Quit date: 1998    Years since quitting: 24.2  . Smokeless tobacco: Never Used  Vaping Use  . Vaping Use: Every day  Substance Use Topics  . Alcohol use: Yes    Comment: 40 oz every day  . Drug use: Yes    Types: Marijuana    Home Medications Prior to Admission medications   Medication Sig Start Date End Date Taking? Authorizing Provider  acetaminophen (TYLENOL) 325 MG tablet Take 650 mg by mouth every 6 (six) hours as needed for mild pain or headache.    [provider]  benzonatate (TESSALON) 100 MG capsule Take 1 capsule (100 mg total) by mouth every 8 (eight) hours as needed for cough. 02/02/20   Haskel Schroeder, PA-C  Multiple Vitamin (MULTIVITAMIN ADULT PO) Take 1 tablet by mouth daily.    [provider]    Allergies     Penicillins  Review of Systems   Review of Systems  Constitutional: Negative for chills, fatigue and fever.  HENT: Positive for ear pain and facial swelling. Negative for congestion, ear discharge, hearing loss, rhinorrhea, sinus pressure and sore throat.   Eyes: Negative for redness.  Respiratory: Negative for cough and wheezing.   Gastrointestinal: Negative for abdominal pain, diarrhea, nausea and vomiting.  Genitourinary: Negative for dysuria.  Musculoskeletal: Negative for myalgias and neck stiffness.  Skin: Negative for rash.  Neurological: Negative for headaches.  Hematological: Negative for adenopathy.    Physical Exam Updated Vital Signs BP (!) 133/104   Pulse 84   Temp (!) 97.4 F (36.3 C) (Oral)   Resp 18   Ht 5\' 11"  (1.803 m)   Wt (!) 670.4 kg   SpO2 97%   BMI 206.14 kg/m   Physical Exam Vitals and nursing note reviewed.  Constitutional:      Appearance: He is well-developed.  HENT:     Head: Normocephalic and atraumatic.     Jaw: No trismus.     Comments: No facial swelling seen or palpated.    Right Ear: Tympanic membrane normal. Swelling present.     Left Ear: Tympanic membrane, ear canal and external ear normal.     Ears:  Comments: Patient with mild swelling of the ear canal.  There is a hard piece of cerumen blocking the inferior portion of the TM and canal, visible portion of TM appears normal.  Patient has pain with tugging on the pinna.    Nose: Nose normal. No mucosal edema or rhinorrhea.     Mouth/Throat:     Mouth: Mucous membranes are not dry.     Pharynx: Uvula midline. No oropharyngeal exudate, posterior oropharyngeal erythema or uvula swelling.     Tonsils: No tonsillar abscesses.     Comments: Patient with gold teeth on the bottom.  Patient has several broken teeth in the left upper jaw without obvious infection or abscess. Eyes:     General:        Right eye: No discharge.        Left eye: No discharge.     Conjunctiva/sclera:  Conjunctivae normal.  Cardiovascular:     Rate and Rhythm: Normal rate and regular rhythm.     Heart sounds: Normal heart sounds.  Pulmonary:     Effort: Pulmonary effort is normal. No respiratory distress.     Breath sounds: Normal breath sounds. No wheezing or rales.  Abdominal:     Palpations: Abdomen is soft.     Tenderness: There is no abdominal tenderness.  Musculoskeletal:     Cervical back: Normal range of motion and neck supple.  Skin:    General: Skin is warm and dry.  Neurological:     Mental Status: He is alert.     ED Results / Procedures / Treatments   Labs (all labs ordered are listed, but only abnormal results are displayed) Labs Reviewed - No data to display  EKG None  Radiology No results found.  Procedures Procedures   Medications Ordered in ED Medications  ciprofloxacin-dexamethasone (CIPRODEX) 0.3-0.1 % OTIC (EAR) suspension 4 drop (has no administration in time range)    ED Course  I have reviewed the triage vital signs and the nursing notes.  Pertinent labs & imaging results that were available during my care of the patient were reviewed by me and considered in my medical decision making (see chart for details).  Patient seen and examined.  Will give Ciprodex eardrops to see if this helps.  Encouraged use of NSAIDs as needed.  No contraindications reported.  Vital signs reviewed and are as follows: BP (!) 133/104   Pulse 84   Temp (!) 97.4 F (36.3 C) (Oral)   Resp 18   Ht 5\' 11"  (1.803 m)   Wt (!) 670.4 kg   SpO2 97%   BMI 206.14 kg/m   Encouraged return to the emergency department with worsening pain or swelling.    MDM Rules/Calculators/A&P                          Patient with ear pain, likely related to otitis externa.  Treatment as above.  No concerning findings.  No significant swelling, trismus, fevers or other systemic symptoms today.   Final Clinical Impression(s) / ED Diagnoses Final diagnoses:  Right ear pain     Rx / DC Orders ED Discharge Orders    None       , PA-C 04/07/20 1002    06/07/20, MD 04/08/20 2231

## 2020-04-07 NOTE — ED Triage Notes (Signed)
C/o right earache onset Monday night , states the entire left side of his face is hurting.

## 2020-04-07 NOTE — Discharge Instructions (Signed)
Please read and follow all provided instructions.  Your diagnoses today include:  1. Right ear pain     Tests performed today include:  Vital signs. See below for your results today.   Medications prescribed:   Naproxen - anti-inflammatory pain medication  Do not exceed 500mg  naproxen every 12 hours, take with food  You have been prescribed an anti-inflammatory medication or NSAID. Take with food. Take smallest effective dose for the shortest duration needed for your pain. Stop taking if you experience stomach pain or vomiting.    Ciprodex ear drops - instill 4 drops into right ear twice daily for 7 days  Home care instructions:  Follow any educational materials contained in this packet.  Follow-up instructions: Please follow-up with your primary care provider as needed for further evaluation of your symptoms.  Return instructions:   Please return to the Emergency Department if you experience worsening symptoms.   Please return if you have any other emergent concerns.  Additional Information:  Your vital signs today were: BP (!) 133/104   Pulse 84   Temp (!) 97.4 F (36.3 C) (Oral)   Resp 18   Ht 5\' 11"  (1.803 m)   Wt (!) 670.4 kg   SpO2 97%   BMI 206.14 kg/m  If your blood pressure (BP) was elevated above 135/85 this visit, please have this repeated by your doctor within one month. ---------------

## 2020-06-21 ENCOUNTER — Emergency Department (HOSPITAL_COMMUNITY)
Admission: EM | Admit: 2020-06-21 | Discharge: 2020-06-21 | Disposition: A | Discharge status: Home / Self Care | Payer: Self-pay | Attending: Emergency Medicine | Admitting: Emergency Medicine

## 2020-06-21 ENCOUNTER — Encounter (HOSPITAL_COMMUNITY): Payer: Self-pay

## 2020-06-21 ENCOUNTER — Emergency Department (HOSPITAL_COMMUNITY): Payer: Self-pay

## 2020-06-21 ENCOUNTER — Other Ambulatory Visit: Payer: Self-pay

## 2020-06-21 DIAGNOSIS — R109 Unspecified abdominal pain: Secondary | ICD-10-CM | POA: Insufficient documentation

## 2020-06-21 DIAGNOSIS — R42 Dizziness and giddiness: Secondary | ICD-10-CM | POA: Insufficient documentation

## 2020-06-21 DIAGNOSIS — M542 Cervicalgia: Secondary | ICD-10-CM | POA: Insufficient documentation

## 2020-06-21 DIAGNOSIS — Z87891 Personal history of nicotine dependence: Secondary | ICD-10-CM | POA: Insufficient documentation

## 2020-06-21 DIAGNOSIS — S2231XA Fracture of one rib, right side, initial encounter for closed fracture: Secondary | ICD-10-CM | POA: Insufficient documentation

## 2020-06-21 DIAGNOSIS — S0990XA Unspecified injury of head, initial encounter: Secondary | ICD-10-CM | POA: Insufficient documentation

## 2020-06-21 DIAGNOSIS — M791 Myalgia, unspecified site: Secondary | ICD-10-CM | POA: Insufficient documentation

## 2020-06-21 MED ORDER — KETOROLAC TROMETHAMINE 30 MG/ML IJ SOLN
60.0000 mg | Freq: Once | INTRAMUSCULAR | Status: DC
  Filled 2020-06-21: qty 2

## 2020-06-21 MED ORDER — IBUPROFEN 800 MG PO TABS
800.0000 mg | ORAL_TABLET | Freq: Once | ORAL | Status: AC
  Administered 2020-06-21: 800 mg via ORAL
  Filled 2020-06-21: qty 1

## 2020-06-21 MED ORDER — IBUPROFEN 800 MG PO TABS: 800.0000 mg | ORAL_TABLET | Freq: Three times a day (TID) | ORAL | 1 refills | Status: DC | PRN

## 2020-06-21 MED ORDER — LIDOCAINE 5 % EX PTCH: 1.0000 | MEDICATED_PATCH | Freq: Two times a day (BID) | CUTANEOUS | 0 refills | Status: AC
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
## 2020-06-21 NOTE — ED Notes (Signed)
[employer] w/ pt.
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
## 2020-07-23 ENCOUNTER — Emergency Department (HOSPITAL_COMMUNITY): Payer: Self-pay

## 2020-07-23 ENCOUNTER — Emergency Department (HOSPITAL_COMMUNITY)
Admission: EM | Admit: 2020-07-23 | Discharge: 2020-07-23 | Disposition: A | Discharge status: Home / Self Care | Payer: Self-pay | Attending: Emergency Medicine | Admitting: Emergency Medicine

## 2020-07-23 ENCOUNTER — Other Ambulatory Visit: Payer: Self-pay

## 2020-07-23 ENCOUNTER — Encounter (HOSPITAL_COMMUNITY): Payer: Self-pay | Admitting: *Deleted

## 2020-07-23 DIAGNOSIS — M7989 Other specified soft tissue disorders: Secondary | ICD-10-CM | POA: Insufficient documentation

## 2020-07-23 DIAGNOSIS — S0003XA Contusion of scalp, initial encounter: Secondary | ICD-10-CM | POA: Insufficient documentation

## 2020-07-23 DIAGNOSIS — F32A Depression, unspecified: Secondary | ICD-10-CM | POA: Insufficient documentation

## 2020-07-23 DIAGNOSIS — Y900 Blood alcohol level of less than 20 mg/100 ml: Secondary | ICD-10-CM | POA: Insufficient documentation

## 2020-07-23 DIAGNOSIS — R45851 Suicidal ideations: Secondary | ICD-10-CM | POA: Insufficient documentation

## 2020-07-23 DIAGNOSIS — Z87891 Personal history of nicotine dependence: Secondary | ICD-10-CM | POA: Insufficient documentation

## 2020-07-23 DIAGNOSIS — S0990XA Unspecified injury of head, initial encounter: Secondary | ICD-10-CM

## 2020-07-23 DIAGNOSIS — S30810A Abrasion of lower back and pelvis, initial encounter: Secondary | ICD-10-CM | POA: Insufficient documentation

## 2020-07-23 DIAGNOSIS — Y92038 Other place in apartment as the place of occurrence of the external cause: Secondary | ICD-10-CM | POA: Insufficient documentation

## 2020-07-23 LAB — COMPREHENSIVE METABOLIC PANEL
ALT: 21 U/L (ref 0–44)
AST: 46 U/L — ABNORMAL HIGH (ref 15–41)
Albumin: 3.9 g/dL (ref 3.5–5.0)
Alkaline Phosphatase: 78 U/L (ref 38–126)
Anion gap: 11 (ref 5–15)
BUN: 14 mg/dL (ref 6–20)
CO2: 27 mmol/L (ref 22–32)
Calcium: 9.2 mg/dL (ref 8.9–10.3)
Chloride: 96 mmol/L — ABNORMAL LOW (ref 98–111)
Creatinine, Ser: 1.15 mg/dL (ref 0.61–1.24)
GFR, Estimated: >60 mL/min (ref >60)
Glucose, Bld: 75 mg/dL (ref 70–99)
Potassium: 3.7 mmol/L (ref 3.5–5.1)
Sodium: 134 mmol/L — ABNORMAL LOW (ref 135–145)
Total Bilirubin: 3.5 mg/dL — ABNORMAL HIGH (ref 0.3–1.2)
Total Protein: 6.7 g/dL (ref 6.5–8.1)

## 2020-07-23 LAB — CBC WITH DIFFERENTIAL/PLATELET
Abs Immature Granulocytes: 0.05 10*3/uL (ref 0.00–0.07)
Basophils Absolute: 0 10*3/uL (ref 0.0–0.1)
Basophils Relative: 0 %
Eosinophils Absolute: 0.1 10*3/uL (ref 0.0–0.5)
Eosinophils Relative: 1 %
HCT: 42.5 % (ref 39.0–52.0)
Hemoglobin: 14.8 g/dL (ref 13.0–17.0)
Immature Granulocytes: 0 %
Lymphocytes Relative: 17 %
Lymphs Abs: 1.9 10*3/uL (ref 0.7–4.0)
MCH: 32.8 pg (ref 26.0–34.0)
MCHC: 34.8 g/dL (ref 30.0–36.0)
MCV: 94.2 fL (ref 80.0–100.0)
Monocytes Absolute: 0.8 10*3/uL (ref 0.1–1.0)
Monocytes Relative: 7 %
Neutro Abs: 8.5 10*3/uL — ABNORMAL HIGH (ref 1.7–7.7)
Neutrophils Relative %: 75 %
Platelets: 311 10*3/uL (ref 150–400)
RBC: 4.51 MIL/uL (ref 4.22–5.81)
RDW: 12.2 % (ref 11.5–15.5)
WBC: 11.4 10*3/uL — ABNORMAL HIGH (ref 4.0–10.5)
nRBC: 0 % (ref 0.0–0.2)

## 2020-07-23 LAB — ETHANOL: Alcohol, Ethyl (B): <10 mg/dL (ref <10)

## 2020-07-23 LAB — RAPID URINE DRUG SCREEN, HOSP PERFORMED
Amphetamines: NOT DETECTED
Barbiturates: NOT DETECTED
Benzodiazepines: NOT DETECTED
Cocaine: POSITIVE — AB
Opiates: NOT DETECTED
Tetrahydrocannabinol: POSITIVE — AB

## 2020-07-23 NOTE — ED Notes (Signed)
Ace wrap applied to left foot.
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
## 2020-07-23 NOTE — Discharge Instructions (Signed)
As we discussed, the imaging of your head today was reassuring.  We have given you some outpatient resources that he can follow-up.  Return to emergency department for any worsening headache, vomiting, thoughts of wanting to hurt or kill yourself or anyone else.
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
## 2020-07-23 NOTE — ED Notes (Signed)
TTS machine in pts room.
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
## 2020-07-23 NOTE — ED Notes (Signed)
Pt given [place] sandwich and ginger ale per RN.

## 2020-07-23 NOTE — Progress Notes (Addendum)
Ralph Daugherty was seen and evaluated by this NP via teleassessment.  Currently denying suicidal or homicidal ideations.  Reports he is currently followed by behavioral health for medication management however has not been in a few weeks.  Will recommend discharge with outpatient follow-up.  Please provide open access hours for medication management and for therapy services.

## 2020-07-23 NOTE — BH Assessment (Signed)
TTS attempted to see patient. TTS secure chat with Verne Carrow, RN, asking if the patient was ready to be seen. She asked if the machine needed to be put in the room. TTS stated yes and asked to be informed when the machine was in the room. That was 30 minutes ago. TTS attempted to call the machine no answer. TTS will attempt to see patient later this date.

## 2020-11-15 ENCOUNTER — Emergency Department (HOSPITAL_COMMUNITY): Admission: EM | Admit: 2020-11-15 | Discharge: 2020-11-16 | Discharge status: Against Medical Advice | Payer: Self-pay

## 2020-11-15 NOTE — ED Notes (Signed)
Pt decided to leave 

## 2021-06-26 ENCOUNTER — Encounter (HOSPITAL_COMMUNITY): Payer: Self-pay

## 2021-06-26 ENCOUNTER — Other Ambulatory Visit: Payer: Self-pay

## 2021-06-26 ENCOUNTER — Emergency Department (HOSPITAL_COMMUNITY): Payer: PRIVATE HEALTH INSURANCE

## 2021-06-26 ENCOUNTER — Emergency Department (HOSPITAL_COMMUNITY)
Admission: EM | Admit: 2021-06-26 | Discharge: 2021-06-26 | Disposition: A | Discharge status: Home / Self Care | Payer: PRIVATE HEALTH INSURANCE | Attending: Emergency Medicine | Admitting: Emergency Medicine

## 2021-06-26 DIAGNOSIS — S0101XA Laceration without foreign body of scalp, initial encounter: Secondary | ICD-10-CM | POA: Diagnosis not present

## 2021-06-26 DIAGNOSIS — R7309 Other abnormal glucose: Secondary | ICD-10-CM | POA: Diagnosis not present

## 2021-06-26 DIAGNOSIS — I959 Hypotension, unspecified: Secondary | ICD-10-CM | POA: Diagnosis not present

## 2021-06-26 DIAGNOSIS — S0990XA Unspecified injury of head, initial encounter: Secondary | ICD-10-CM

## 2021-06-26 DIAGNOSIS — Z23 Encounter for immunization: Secondary | ICD-10-CM | POA: Diagnosis not present

## 2021-06-26 DIAGNOSIS — R770 Abnormality of albumin: Secondary | ICD-10-CM | POA: Insufficient documentation

## 2021-06-26 DIAGNOSIS — R55 Syncope and collapse: Secondary | ICD-10-CM | POA: Diagnosis not present

## 2021-06-26 DIAGNOSIS — Y900 Blood alcohol level of less than 20 mg/100 ml: Secondary | ICD-10-CM | POA: Diagnosis not present

## 2021-06-26 DIAGNOSIS — W01198A Fall on same level from slipping, tripping and stumbling with subsequent striking against other object, initial encounter: Secondary | ICD-10-CM | POA: Diagnosis not present

## 2021-06-26 DIAGNOSIS — R7989 Other specified abnormal findings of blood chemistry: Secondary | ICD-10-CM | POA: Insufficient documentation

## 2021-06-26 LAB — CBC WITH DIFFERENTIAL/PLATELET
Abs Immature Granulocytes: 0.04 10*3/uL (ref 0.00–0.07)
Basophils Absolute: 0 10*3/uL (ref 0.0–0.1)
Basophils Relative: 0 %
Eosinophils Absolute: 0.1 10*3/uL (ref 0.0–0.5)
Eosinophils Relative: 1 %
HCT: 41.4 % (ref 39.0–52.0)
Hemoglobin: 14.1 g/dL (ref 13.0–17.0)
Immature Granulocytes: 0 %
Lymphocytes Relative: 16 %
Lymphs Abs: 1.6 10*3/uL (ref 0.7–4.0)
MCH: 33 pg (ref 26.0–34.0)
MCHC: 34.1 g/dL (ref 30.0–36.0)
MCV: 97 fL (ref 80.0–100.0)
Monocytes Absolute: 0.4 10*3/uL (ref 0.1–1.0)
Monocytes Relative: 4 %
Neutro Abs: 8.2 10*3/uL — ABNORMAL HIGH (ref 1.7–7.7)
Neutrophils Relative %: 79 %
Platelets: 287 10*3/uL (ref 150–400)
RBC: 4.27 MIL/uL (ref 4.22–5.81)
RDW: 12.5 % (ref 11.5–15.5)
WBC: 10.3 10*3/uL (ref 4.0–10.5)
nRBC: 0 % (ref 0.0–0.2)

## 2021-06-26 LAB — COMPREHENSIVE METABOLIC PANEL
ALT: 15 U/L (ref 0–44)
AST: 18 U/L (ref 15–41)
Albumin: 3.3 g/dL — ABNORMAL LOW (ref 3.5–5.0)
Alkaline Phosphatase: 64 U/L (ref 38–126)
Anion gap: 7 (ref 5–15)
BUN: 19 mg/dL (ref 6–20)
CO2: 26 mmol/L (ref 22–32)
Calcium: 8.7 mg/dL — ABNORMAL LOW (ref 8.9–10.3)
Chloride: 103 mmol/L (ref 98–111)
Creatinine, Ser: 1.31 mg/dL — ABNORMAL HIGH (ref 0.61–1.24)
GFR, Estimated: >60 mL/min (ref >60)
Glucose, Bld: 151 mg/dL — ABNORMAL HIGH (ref 70–99)
Potassium: 4.3 mmol/L (ref 3.5–5.1)
Sodium: 136 mmol/L (ref 135–145)
Total Bilirubin: 1.1 mg/dL (ref 0.3–1.2)
Total Protein: 5.8 g/dL — ABNORMAL LOW (ref 6.5–8.1)

## 2021-06-26 LAB — TROPONIN I (HIGH SENSITIVITY): Troponin I (High Sensitivity): 4 ng/L (ref <18)

## 2021-06-26 LAB — ETHANOL: Alcohol, Ethyl (B): <10 mg/dL (ref <10)

## 2021-06-26 MED ORDER — SODIUM CHLORIDE 0.9 % IV BOLUS
1000.0000 mL | Freq: Once | INTRAVENOUS | Status: AC
  Administered 2021-06-26: 1000 mL via INTRAVENOUS

## 2021-06-26 MED ORDER — TETANUS-DIPHTH-ACELL PERTUSSIS 5-2.5-18.5 LF-MCG/0.5 IM SUSY
0.5000 mL | PREFILLED_SYRINGE | Freq: Once | INTRAMUSCULAR | Status: AC
  Administered 2021-06-26: 0.5 mL via INTRAMUSCULAR
  Filled 2021-06-26: qty 0.5

## 2021-06-26 MED ORDER — LIDOCAINE-EPINEPHRINE (PF) 2 %-1:200000 IJ SOLN
10.0000 mL | Freq: Once | INTRAMUSCULAR | Status: AC
  Administered 2021-06-26: 10 mL
  Filled 2021-06-26: qty 20
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
## 2021-06-26 NOTE — ED Notes (Signed)
Discharge instructions reviewed with patient. Patient verbalized understanding of instructions. Follow-up care and medications were reviewed. Patient ambulatory with steady gait. VSS upon discharge.  ?
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
## 2021-07-04 ENCOUNTER — Other Ambulatory Visit: Payer: Self-pay

## 2021-07-04 ENCOUNTER — Emergency Department (HOSPITAL_COMMUNITY)
Admission: EM | Admit: 2021-07-04 | Discharge: 2021-07-04 | Disposition: A | Discharge status: Home / Self Care | Payer: PRIVATE HEALTH INSURANCE | Attending: Emergency Medicine | Admitting: Emergency Medicine

## 2021-07-04 ENCOUNTER — Encounter (HOSPITAL_COMMUNITY): Payer: Self-pay

## 2021-07-04 DIAGNOSIS — Z4802 Encounter for removal of sutures: Secondary | ICD-10-CM | POA: Insufficient documentation

## 2021-07-04 NOTE — Discharge Instructions (Signed)
Wash with warm soapy water.  Please do not hesitate to return to emergency department for worrisome signs symptoms discussed become apparent.

## 2021-07-04 NOTE — ED Triage Notes (Signed)
Pt arrived POV stating he needed to get the staples out of his head. Pt denies any pain at this time and states he went back to work that everything has been fine.

## 2021-07-04 NOTE — ED Provider Notes (Signed)
  [facility] EMERGENCY DEPARTMENT Provider Note   CSN: [id number] Arrival date & time: 07/04/21  1810     History {Add pertinent medical, surgical, social history, OB history to HPI:1} Chief Complaint  Patient presents with   Suture / Staple Removal    Ralph Daugherty is a 51 y.o. male.   Suture / Staple Removal   Presents for suture removal.  Home Medications Prior to Admission medications   Medication Sig Start Date End Date Taking? Authorizing Provider  benzonatate (TESSALON) 100 MG capsule Take 1 capsule (100 mg total) by mouth every 8 (eight) hours as needed for cough. Patient not taking: Reported on 07/23/2020 02/02/20   Loni Beckwith, PA-C  ibuprofen (ADVIL) 800 MG tablet Take 1 tablet (800 mg total) by mouth every 8 (eight) hours as needed. Patient not taking: Reported on 07/23/2020 06/21/20   Lacinda Axon, MD  naproxen (NAPROSYN) 500 MG tablet Take 1 tablet (500 mg total) by mouth 2 (two) times daily. Patient not taking: Reported on 07/23/2020 04/07/20   Carlisle Cater, PA-C      Allergies    Other and Penicillins    Review of Systems   Review of Systems  Physical Exam Updated Vital Signs BP (!) 147/97 (BP Location: Right Arm)   Pulse (!) 106   Temp 97.9 F (36.6 C) (Oral)   Resp 16   Ht 5\' 11"  (1.803 m)   Wt 87 kg   SpO2 97%   BMI 26.75 kg/m  Physical Exam  ED Results / Procedures / Treatments   Labs (all labs ordered are listed, but only abnormal results are displayed) Labs Reviewed - No data to display  EKG None  Radiology No results found.  Procedures Procedures  {Document cardiac monitor, telemetry assessment procedure when appropriate:1}  Medications Ordered in ED Medications - No data to display  ED Course/ Medical Decision Making/ A&P                           Medical Decision Making  ***  {Document critical care time when appropriate:1} {Document review of labs and clinical decision tools ie  heart score, Chads2Vasc2 etc:1}  {Document your independent review of radiology images, and any outside records:1} {Document your discussion with family members, caretakers, and with consultants:1} {Document social determinants of health affecting pt's care:1} {Document your decision making why or why not admission, treatments were needed:1} Final Clinical Impression(s) / ED Diagnoses Final diagnoses:  None    Rx / DC Orders ED Discharge Orders     None

## 2023-06-30 ENCOUNTER — Emergency Department (HOSPITAL_COMMUNITY): Payer: PRIVATE HEALTH INSURANCE

## 2023-06-30 ENCOUNTER — Emergency Department (HOSPITAL_COMMUNITY)
Admission: EM | Admit: 2023-06-30 | Discharge: 2023-06-30 | Disposition: A | Discharge status: Home / Self Care | Payer: PRIVATE HEALTH INSURANCE | Attending: Emergency Medicine | Admitting: Emergency Medicine

## 2023-06-30 DIAGNOSIS — F1910 Other psychoactive substance abuse, uncomplicated: Secondary | ICD-10-CM

## 2023-06-30 DIAGNOSIS — R079 Chest pain, unspecified: Secondary | ICD-10-CM

## 2023-06-30 DIAGNOSIS — Z87891 Personal history of nicotine dependence: Secondary | ICD-10-CM | POA: Insufficient documentation

## 2023-06-30 DIAGNOSIS — R0789 Other chest pain: Secondary | ICD-10-CM | POA: Diagnosis not present

## 2023-06-30 LAB — CBC
HCT: 44.6 % (ref 39.0–52.0)
Hemoglobin: 15.4 g/dL (ref 13.0–17.0)
MCH: 31.8 pg (ref 26.0–34.0)
MCHC: 34.5 g/dL (ref 30.0–36.0)
MCV: 92.1 fL (ref 80.0–100.0)
Platelets: 315 10*3/uL (ref 150–400)
RBC: 4.84 MIL/uL (ref 4.22–5.81)
RDW: 12.5 % (ref 11.5–15.5)
WBC: 14.4 10*3/uL — ABNORMAL HIGH (ref 4.0–10.5)
nRBC: 0 % (ref 0.0–0.2)

## 2023-06-30 LAB — ETHANOL: Alcohol, Ethyl (B): 91 mg/dL — ABNORMAL HIGH (ref <15)

## 2023-06-30 LAB — HEPATIC FUNCTION PANEL
ALT: 26 U/L (ref 0–44)
AST: 34 U/L (ref 15–41)
Albumin: 3.5 g/dL (ref 3.5–5.0)
Alkaline Phosphatase: 80 U/L (ref 38–126)
Bilirubin, Direct: 0.2 mg/dL (ref 0.0–0.2)
Indirect Bilirubin: 0.8 mg/dL (ref 0.3–0.9)
Total Bilirubin: 1 mg/dL (ref 0.0–1.2)
Total Protein: 6.7 g/dL (ref 6.5–8.1)

## 2023-06-30 LAB — RAPID URINE DRUG SCREEN, HOSP PERFORMED
Amphetamines: NOT DETECTED
Barbiturates: NOT DETECTED
Benzodiazepines: NOT DETECTED
Cocaine: POSITIVE — AB
Opiates: NOT DETECTED
Tetrahydrocannabinol: POSITIVE — AB

## 2023-06-30 LAB — BASIC METABOLIC PANEL WITH GFR
Anion gap: 19 — ABNORMAL HIGH (ref 5–15)
BUN: 22 mg/dL — ABNORMAL HIGH (ref 6–20)
CO2: 19 mmol/L — ABNORMAL LOW (ref 22–32)
Calcium: 9.5 mg/dL (ref 8.9–10.3)
Chloride: 96 mmol/L — ABNORMAL LOW (ref 98–111)
Creatinine, Ser: 1.18 mg/dL (ref 0.61–1.24)
GFR, Estimated: >60 mL/min (ref >60)
Glucose, Bld: 92 mg/dL (ref 70–99)
Potassium: 4.1 mmol/L (ref 3.5–5.1)
Sodium: 134 mmol/L — ABNORMAL LOW (ref 135–145)

## 2023-06-30 LAB — TROPONIN I (HIGH SENSITIVITY)
Troponin I (High Sensitivity): 6 ng/L (ref <18)
Troponin I (High Sensitivity): 9 ng/L (ref <18)

## 2023-06-30 LAB — LIPASE, BLOOD: Lipase: 26 U/L (ref 11–51)

## 2023-06-30 MED ORDER — PANTOPRAZOLE SODIUM 40 MG IV SOLR
40.0000 mg | Freq: Once | INTRAVENOUS | Status: AC
  Administered 2023-06-30: 40 mg via INTRAVENOUS
  Filled 2023-06-30: qty 10

## 2023-06-30 MED ORDER — FAMOTIDINE IN NACL 20-0.9 MG/50ML-% IV SOLN
20.0000 mg | Freq: Once | INTRAVENOUS | Status: AC
  Administered 2023-06-30: 20 mg via INTRAVENOUS
  Filled 2023-06-30: qty 50

## 2023-06-30 MED ORDER — ONDANSETRON HCL 4 MG/2ML IJ SOLN
4.0000 mg | Freq: Once | INTRAMUSCULAR | Status: AC
  Administered 2023-06-30: 4 mg via INTRAVENOUS
  Filled 2023-06-30: qty 2

## 2023-06-30 MED ORDER — SODIUM CHLORIDE 0.9 % IV BOLUS
1000.0000 mL | Freq: Once | INTRAVENOUS | Status: AC
  Administered 2023-06-30: 1000 mL via INTRAVENOUS

## 2023-06-30 NOTE — Discharge Instructions (Signed)
 Follow up with your Physician for recheck.  Rest today.  Drink plenty of fluids. Avoid alcohol and other substances
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
## 2023-06-30 NOTE — ED Notes (Signed)
 Discharge paperwork gone over with patient. Educated on need to obtain a PCP and how to go on insurance website to look up providers. No questions voiced at this time.
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
## 2023-07-01 LAB — CBG MONITORING, ED: Glucose-Capillary: 97 mg/dL (ref 70–99)

## 2023-08-12 DIAGNOSIS — R0981 Nasal congestion: Secondary | ICD-10-CM | POA: Diagnosis not present

## 2023-08-12 DIAGNOSIS — Z20822 Contact with and (suspected) exposure to covid-19: Secondary | ICD-10-CM | POA: Diagnosis not present

## 2023-08-12 DIAGNOSIS — R051 Acute cough: Secondary | ICD-10-CM | POA: Diagnosis not present

## 2023-08-12 DIAGNOSIS — J069 Acute upper respiratory infection, unspecified: Secondary | ICD-10-CM | POA: Diagnosis not present

## 2023-08-17 IMAGING — CT CT HEAD W/O CM
4 series · 16 of 47 positions shown, 18 images · non-contrast
Comparison: PET-CT on 07/23/2020, and C-spine CT on 06/21/2020

CLINICAL DATA: Syncopal episode with fall. Blunt trauma to head and
neck. Loss of consciousness.



[Series 3: head wo · axial · 0.41mm/px · z∈[+1365,+1480]mm · 7 of 31 slices shown, 9 images]
[im 4/31  brain]
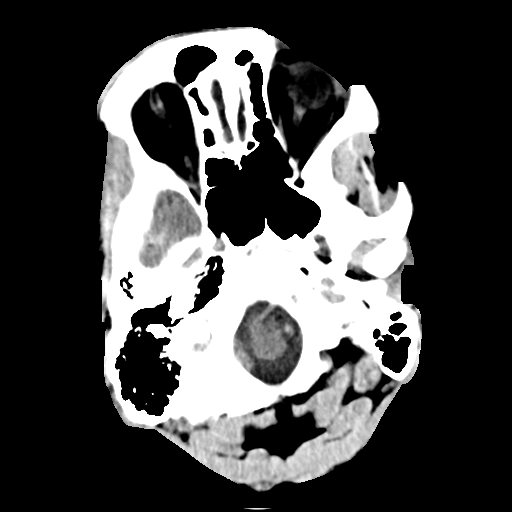
[im 4/31  bone]
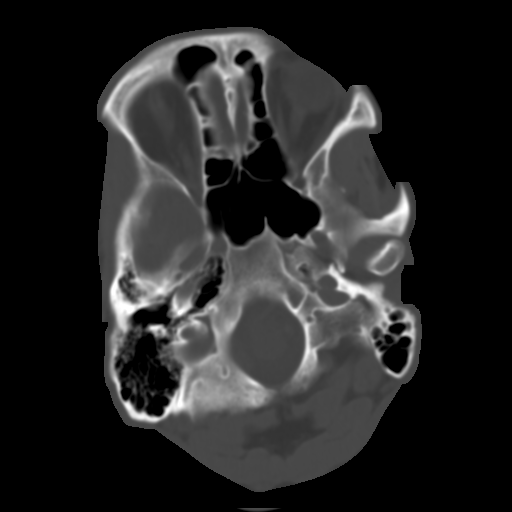
[im 8/31  brain]
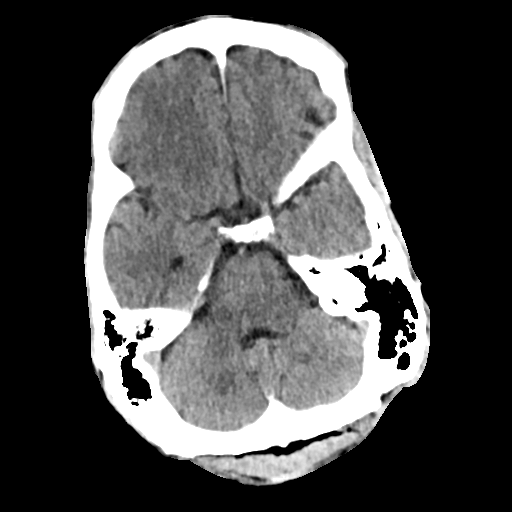
[im 12/31  brain]
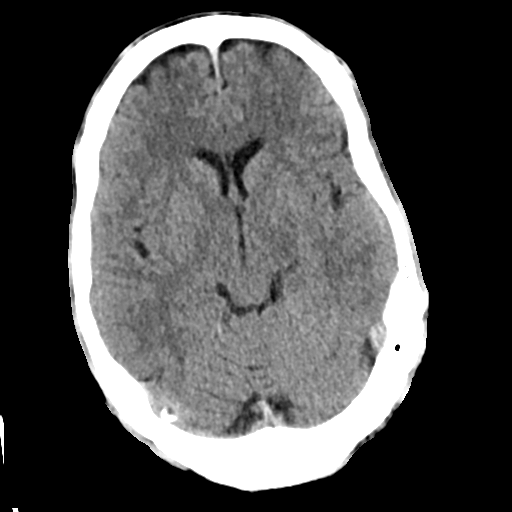
[im 16/31  brain]
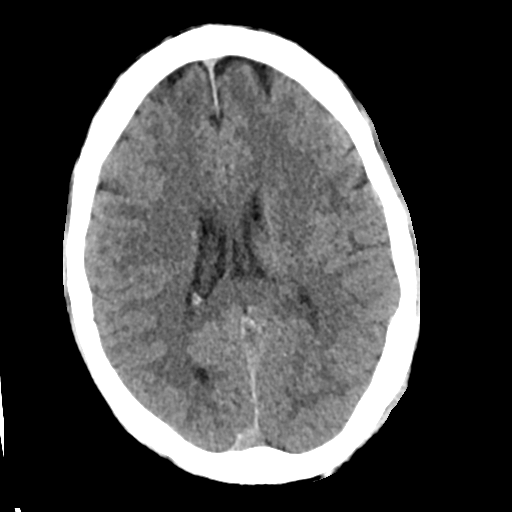
[im 19/31  brain]
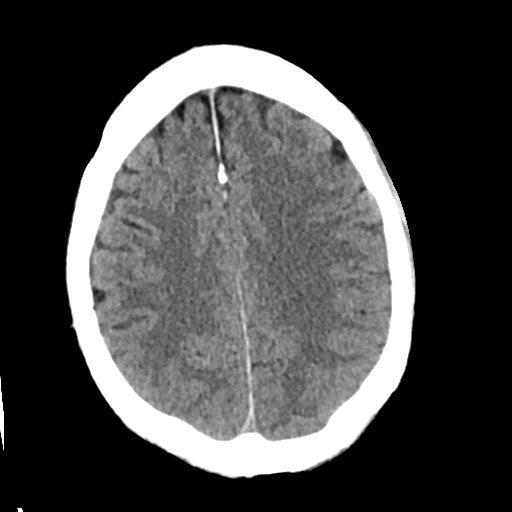
[im 19/31  bone]
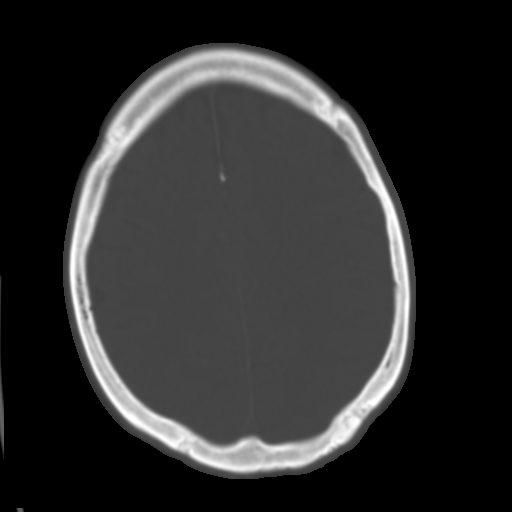
[im 23/31  brain]
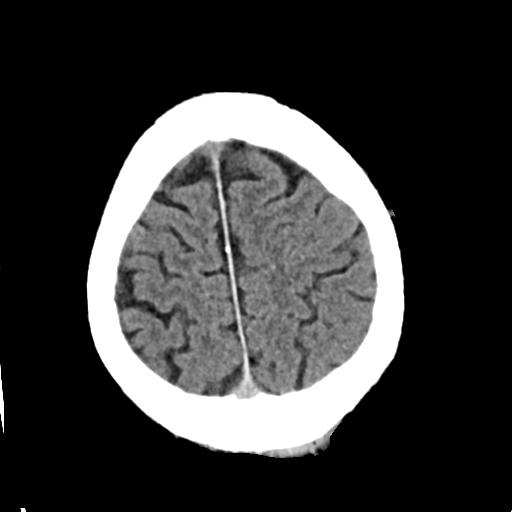
[im 27/31  brain]
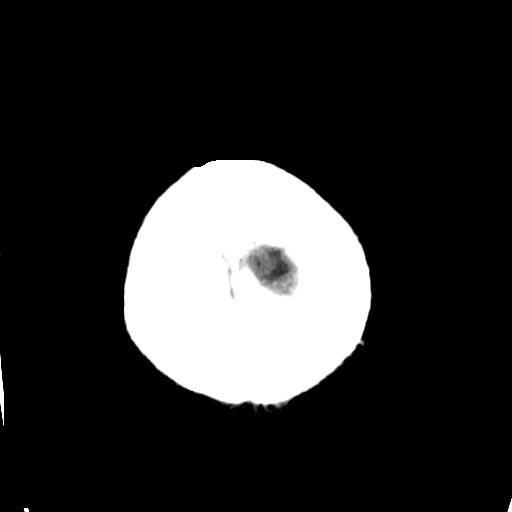

[Series 4: head bone · axial · 0.41mm/px · z∈[+1364,+1394]mm · 3 of 76 slices shown]
[im 8/76  bone]
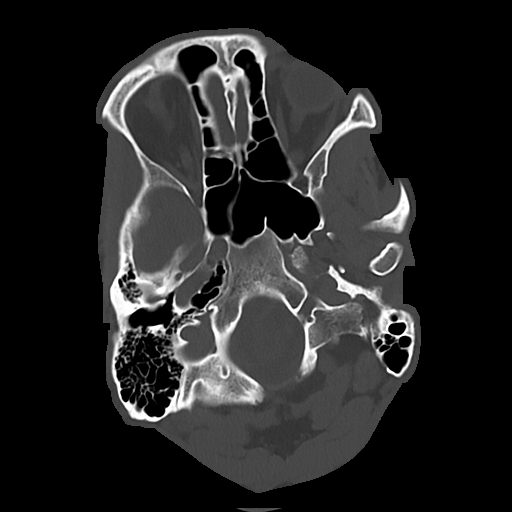
[im 16/76  bone]
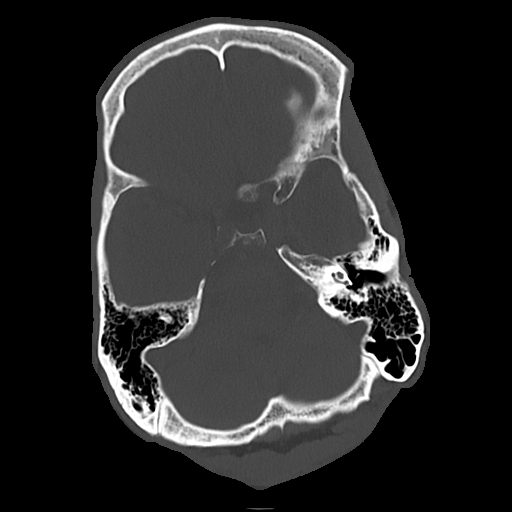
[im 23/76  bone]
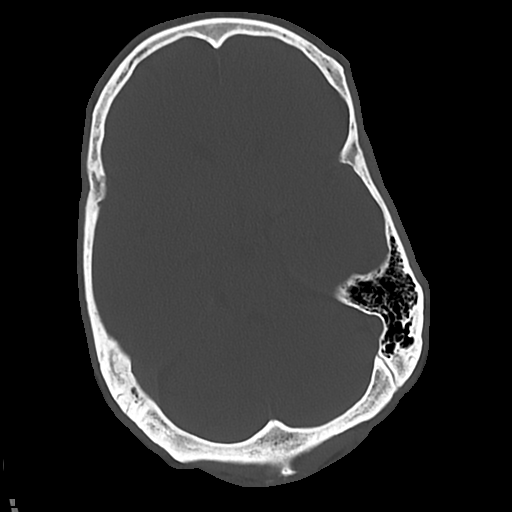

[Series 5: cor soft · coronal · 0.33mm/px · 3 of 70 slices shown]
[im 24/70  brain]
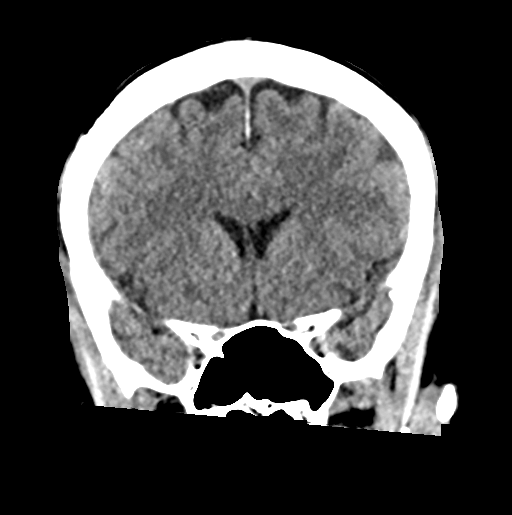
[im 31/70  brain]
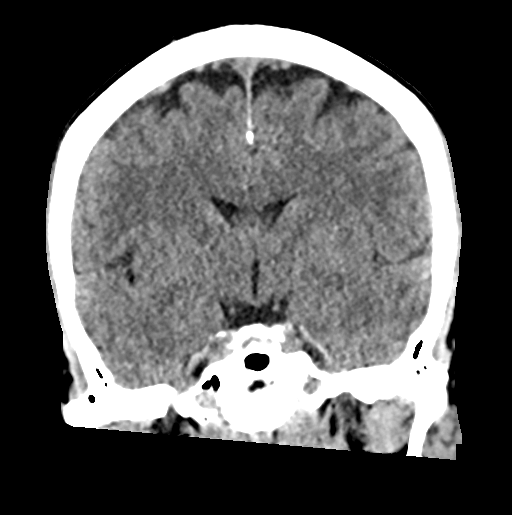
[im 39/70  brain]
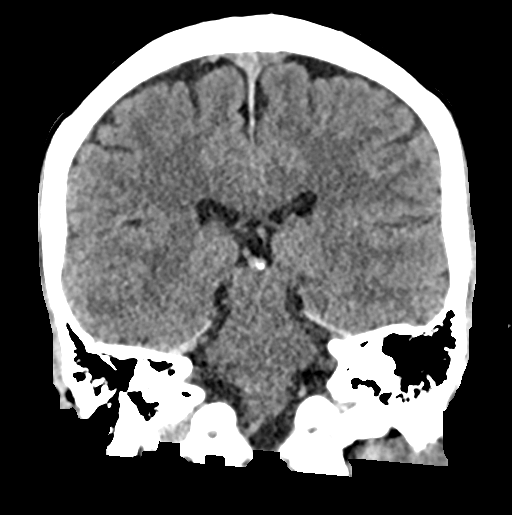

[Series 6: sag soft · sagittal · 0.33mm/px · 3 of 55 slices shown]
[im 19/55  brain]
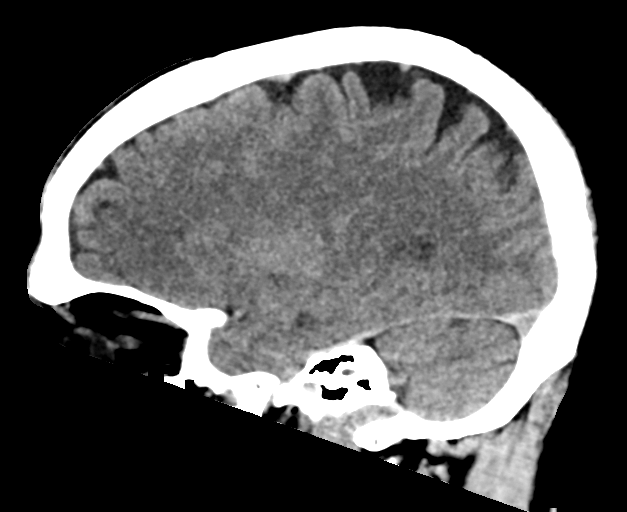
[im 28/55  brain]
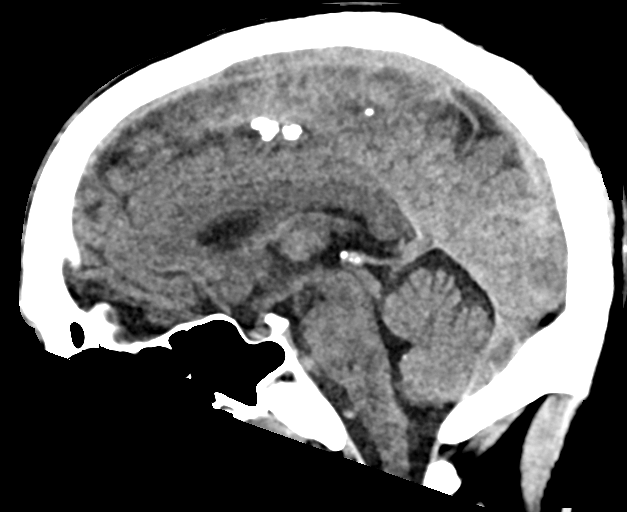
[im 36/55  brain]
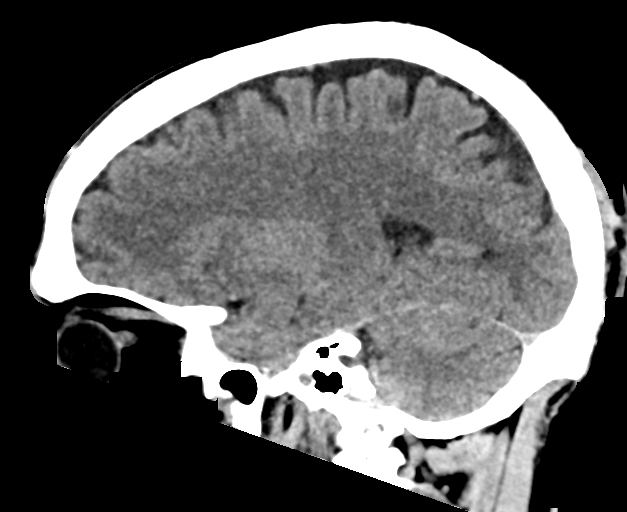

[16 of 47 positions shown; findings below may reference images not displayed]

FINDINGS: CT HEAD FINDINGS

Brain: No evidence of intracranial hemorrhage, acute infarction,
hydrocephalus, extra-axial collection, or mass lesion/mass effect.

Vascular:  No hyperdense vessel or other acute findings.

Skull: No evidence of fracture or other significant bone
abnormality.

Sinuses/Orbits:  No acute findings.

Other: Small left posterior parietal scalp hematoma.

CT CERVICAL SPINE FINDINGS

Alignment: Normal.

Skull base and vertebrae: No acute fracture. No primary bone lesion
or focal pathologic process.

Soft tissues and spinal canal: No prevertebral fluid or swelling. No
visible canal hematoma.

Disc levels: Congenital fusion again seen at C5-6. Severe
degenerative disc disease is again seen at levels of C4-5 C6-7, and
C7-T1. Mild bilateral facet DJD is seen at C7-T1.

Upper chest: No acute findings.

Other: None.
IMPRESSION: Small left posterior parietal scalp hematoma. No evidence of skull
fracture or intracranial abnormality.

No evidence of acute fracture or subluxation of the cervical spine.
Degenerative spondylosis, as described above. Congenital fusion at
C5-6.

## 2023-08-17 IMAGING — CT CT CERVICAL SPINE W/O CM
3 of 4 series · 10 of 35 positions shown, 11 images · non-contrast
Comparison: PET-CT on 07/23/2020, and C-spine CT on 06/21/2020

CLINICAL DATA: Syncopal episode with fall. Blunt trauma to head and
neck. Loss of consciousness.



[Series 7: sag bone · sagittal · 0.24mm/px · 5 of 62 slices shown]
[im 21/62  bone]
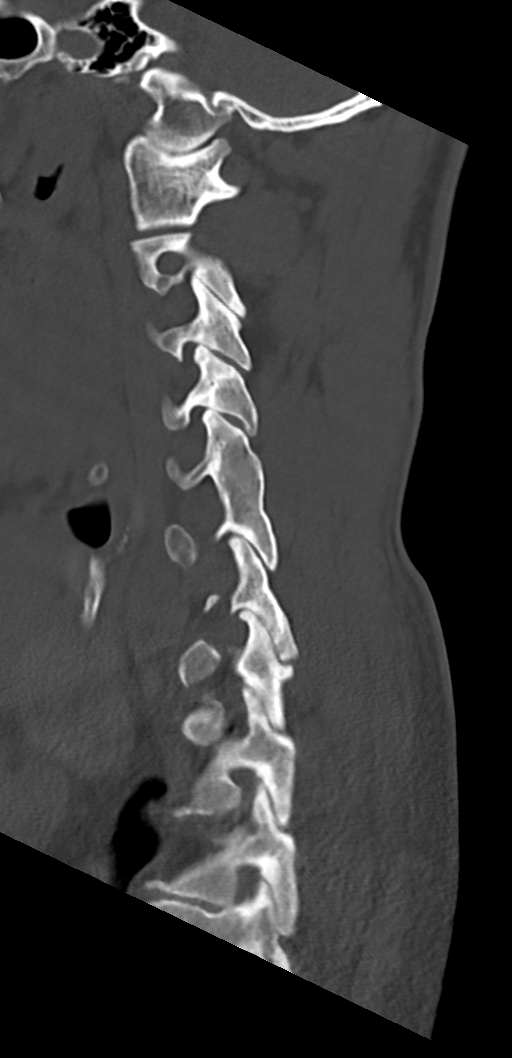
[im 26/62  bone]
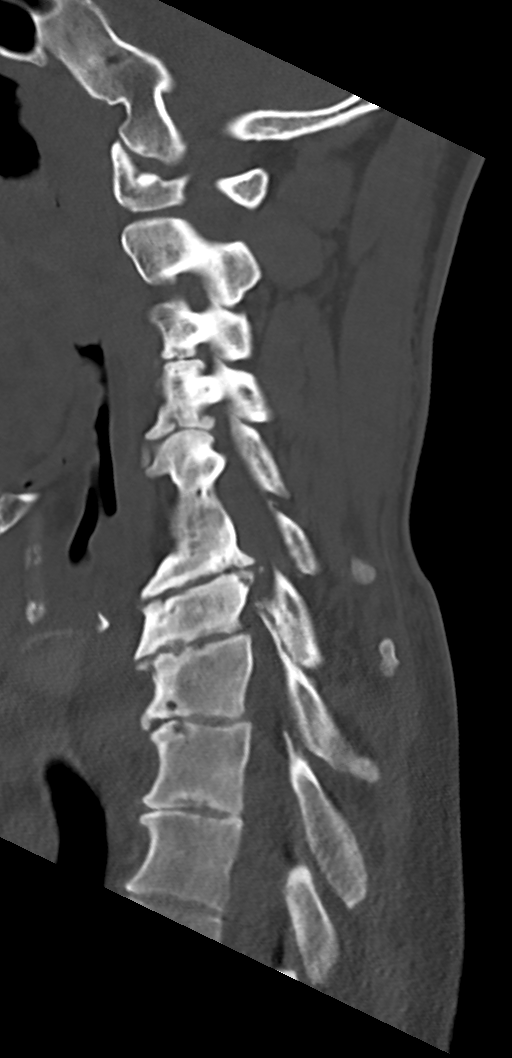
[im 31/62  bone]
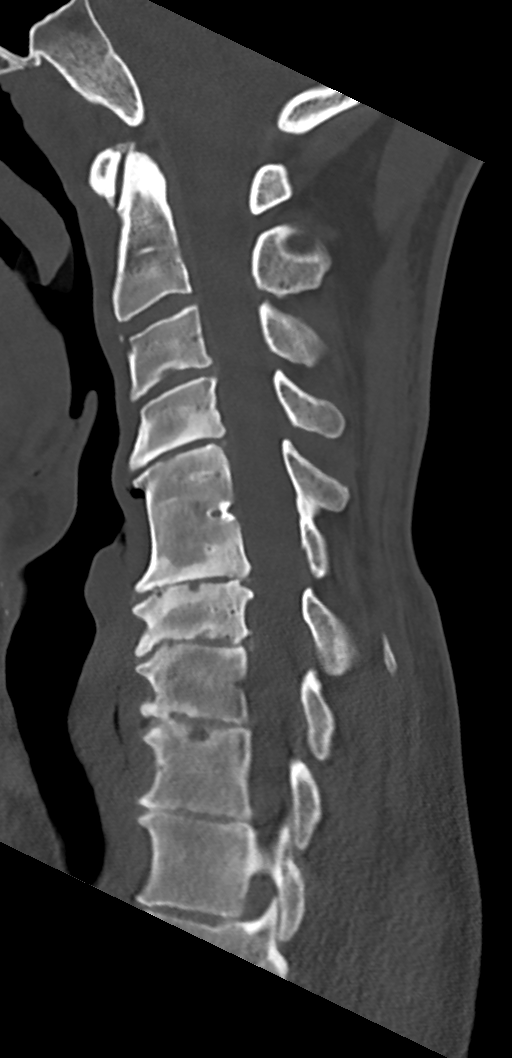
[im 36/62  bone]
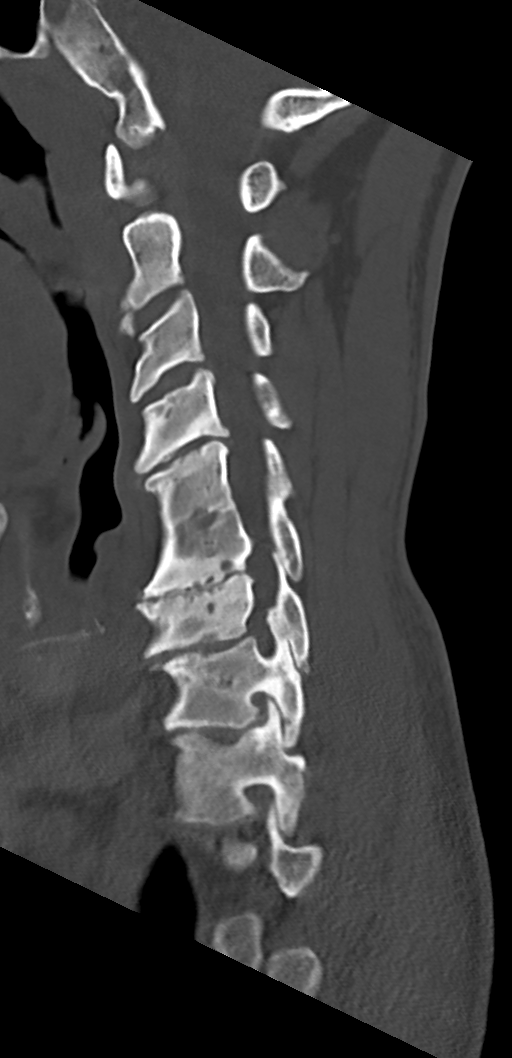
[im 41/62  bone]
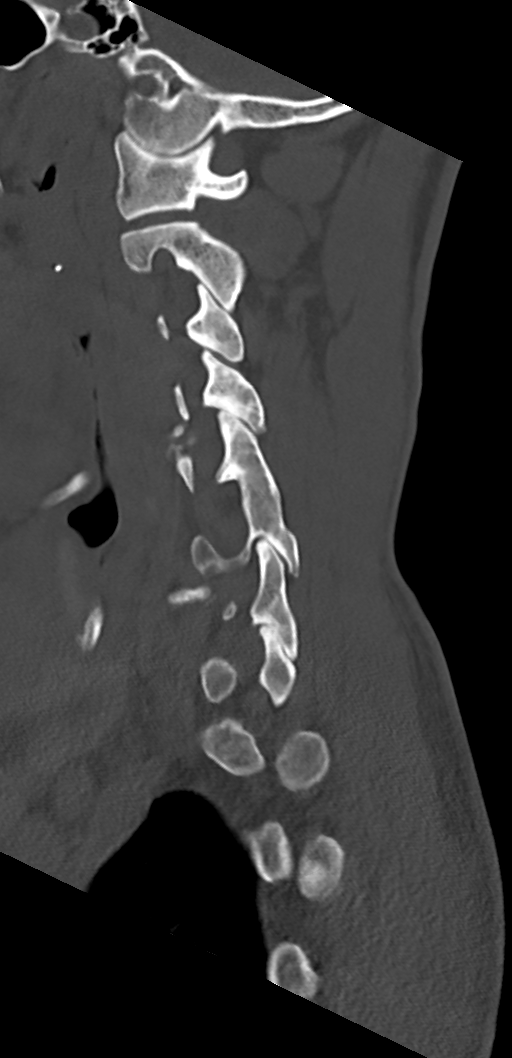

[Series 8: cor bone · coronal · 0.24mm/px · 3 of 62 slices shown]
[im 14/62  bone]
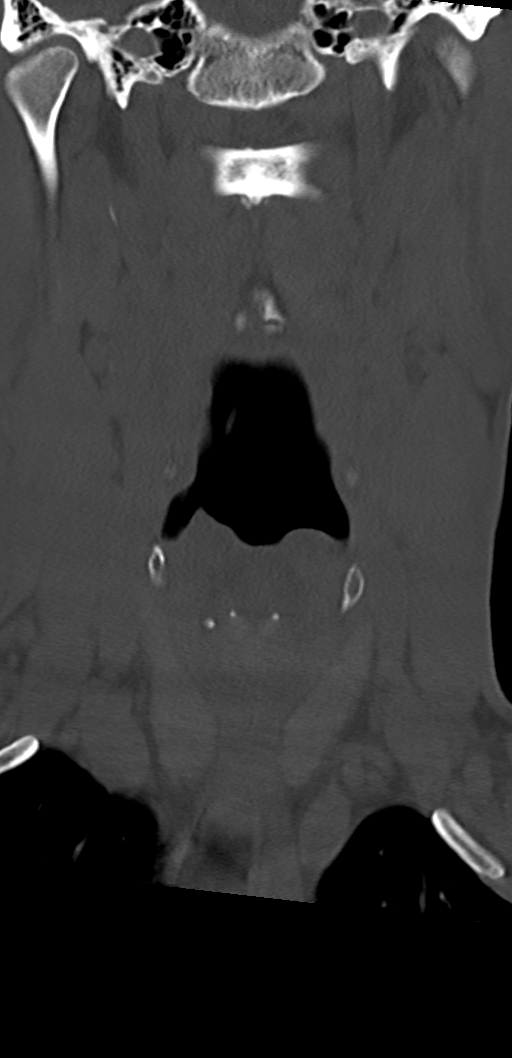
[im 25/62  bone]
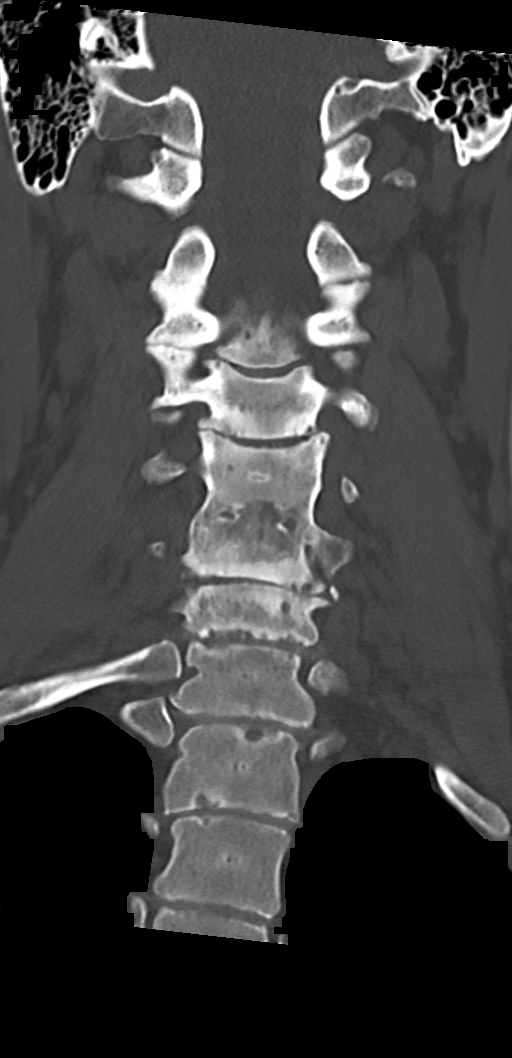
[im 37/62  bone]
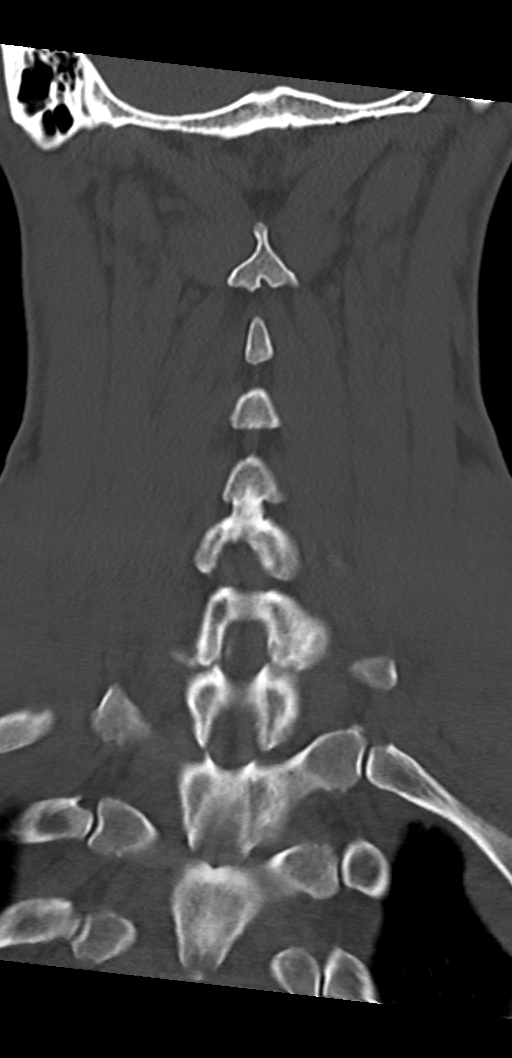

[Series 10: orthogonal axials · axial · 0.21mm/px · z∈[+1232,+1281]mm · 2 of 103 slices shown, 3 images]
[im 35/103  soft-tissue]
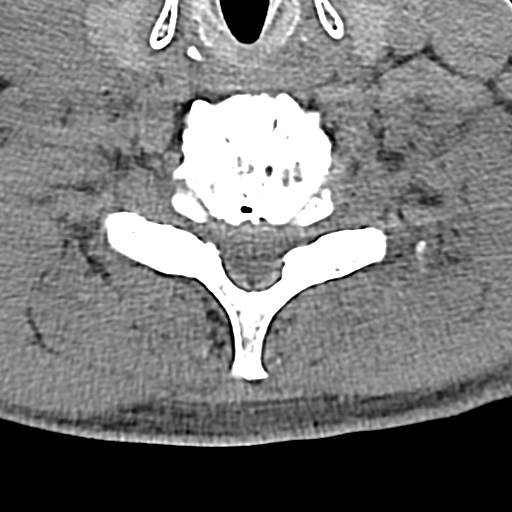
[im 35/103  bone]
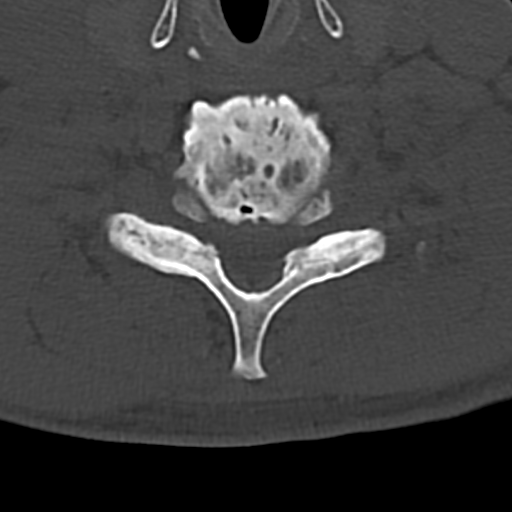
[im 69/103  bone]
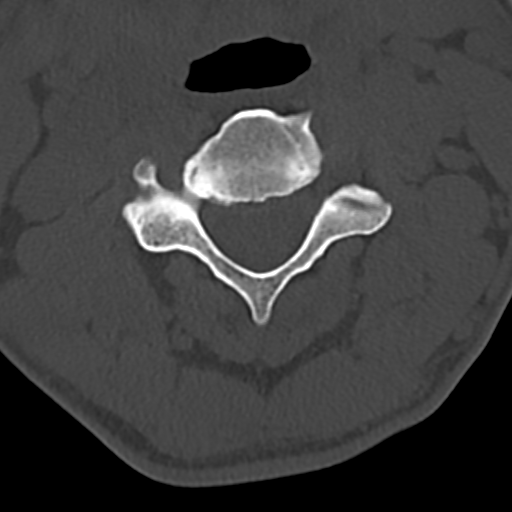

[10 of 35 positions shown; findings below may reference images not displayed]

FINDINGS: CT HEAD FINDINGS

Brain: No evidence of intracranial hemorrhage, acute infarction,
hydrocephalus, extra-axial collection, or mass lesion/mass effect.

Vascular:  No hyperdense vessel or other acute findings.

Skull: No evidence of fracture or other significant bone
abnormality.

Sinuses/Orbits:  No acute findings.

Other: Small left posterior parietal scalp hematoma.

CT CERVICAL SPINE FINDINGS

Alignment: Normal.

Skull base and vertebrae: No acute fracture. No primary bone lesion
or focal pathologic process.

Soft tissues and spinal canal: No prevertebral fluid or swelling. No
visible canal hematoma.

Disc levels: Congenital fusion again seen at C5-6. Severe
degenerative disc disease is again seen at levels of C4-5 C6-7, and
C7-T1. Mild bilateral facet DJD is seen at C7-T1.

Upper chest: No acute findings.

Other: None.
IMPRESSION: Small left posterior parietal scalp hematoma. No evidence of skull
fracture or intracranial abnormality.

No evidence of acute fracture or subluxation of the cervical spine.
Degenerative spondylosis, as described above. Congenital fusion at
C5-6.

## 2023-09-16 ENCOUNTER — Encounter (HOSPITAL_COMMUNITY): Payer: Self-pay | Admitting: Emergency Medicine

## 2023-09-16 ENCOUNTER — Other Ambulatory Visit: Payer: Self-pay

## 2023-09-16 ENCOUNTER — Ambulatory Visit (HOSPITAL_COMMUNITY)
Admission: EM | Admit: 2023-09-16 | Discharge: 2023-09-16 | Disposition: A | Discharge status: Home / Self Care | Attending: Family Medicine | Admitting: Family Medicine

## 2023-09-16 DIAGNOSIS — W19XXXA Unspecified fall, initial encounter: Secondary | ICD-10-CM

## 2023-09-16 DIAGNOSIS — M79642 Pain in left hand: Secondary | ICD-10-CM

## 2023-09-16 NOTE — ED Provider Notes (Addendum)
 [facility]    CSN: [id number] Arrival date & time: 09/16/23  1045      History   Chief Complaint Chief Complaint  Patient presents with   Fall   Letter for School/Work    HPI Ralph Daugherty is a 53 y.o. male.    Fall  Here for left hand and left side pain.  On August 14 he was working at [employer] and he tripped on something and fell over onto his left hand and side.  He was little sore the next day but stayed out of work.  His work is now asked him to verify that he is safe to return to work tomorrow.  He no longer has any pain in his hand or side and there is no swelling.  NKDA  History reviewed. No pertinent past medical history.  There are no active problems to display for this patient.   Past Surgical History:  Procedure Laterality Date   CERVICAL FUSION         Home Medications    Prior to Admission medications   Not on File    Family History History reviewed. No pertinent family history.  Social History Social History   Tobacco Use   Smoking status: Some Days    Current packs/day: 0.00    Types: Cigarettes    Last attempt to quit: 1998    Years since quitting: 27.6   Smokeless tobacco: Never  Vaping Use   Vaping status: Former  Substance Use Topics   Alcohol use: Yes    Comment: 40 oz every day   Drug use: Not Currently    Types: Marijuana, Cocaine     Allergies   Other and Penicillins   Review of Systems Review of Systems   Physical Exam Triage Vital Signs ED Triage Vitals  Encounter Vitals Group     BP 09/16/23 1148 (!) 139/100     Girls Systolic BP Percentile --      Girls Diastolic BP Percentile --      Boys Systolic BP Percentile --      Boys Diastolic BP Percentile --      Pulse Rate 09/16/23 1148 98     Resp 09/16/23 1148 18     Temp 09/16/23 1148 98 F (36.7 C)     Temp Source 09/16/23 1148 Oral     SpO2 09/16/23 1148 99 %     Weight --      Height --      Head Circumference --       Peak Flow --      Pain Score 09/16/23 1146 0     Pain Loc --      Pain Education --      Exclude from Growth Chart --    No data found.  Updated Vital Signs BP (!) 130/91 (BP Location: Right Arm) Comment (BP Location): repositioned  Pulse 98   Temp 98 F (36.7 C) (Oral)   Resp 18   SpO2 99%   Visual Acuity Right Eye Distance:   Left Eye Distance:   Bilateral Distance:    Right Eye Near:   Left Eye Near:    Bilateral Near:     Physical Exam Vitals reviewed.  Constitutional:      General: He is not in acute distress.    Appearance: He is not ill-appearing, toxic-appearing or diaphoretic.  Cardiovascular:     Rate and Rhythm: Normal rate and regular rhythm.  Pulmonary:  Effort: Pulmonary effort is normal.     Breath sounds: Normal breath sounds.  Musculoskeletal:     Comments: There is no swelling or tenderness of any joints of the left arm or hand  No tenderness of the left chest.  Left leg is also normal.  Skin:    Coloration: Skin is not pale.  Neurological:     Mental Status: He is alert.      UC Treatments / Results  Labs (all labs ordered are listed, but only abnormal results are displayed) Labs Reviewed - No data to display  EKG   Radiology No results found.  Procedures Procedures (including critical care time)  Medications Ordered in UC Medications - No data to display  Initial Impression / Assessment and Plan / UC Course  I have reviewed the triage vital signs and the nursing notes.  Pertinent labs & imaging results that were available during my care of the patient were reviewed by me and considered in my medical decision making (see chart for details).     No treatment needed at this point.  Work note is provided. Final Clinical Impressions(s) / UC Diagnoses   Final diagnoses:  Left hand pain  Fall, initial encounter     Discharge Instructions      I think you are safe to return to work tomorrow.     ED  Prescriptions   None    PDMP not reviewed this encounter.   Vonna Sharlet POUR, MD 09/16/23 1217    Vonna Sharlet POUR, MD 09/16/23 [phone]

## 2023-09-16 NOTE — Discharge Instructions (Signed)
 I think you are safe to return to work tomorrow.

## 2023-09-16 NOTE — ED Notes (Signed)
 09/12/2023 patient reports falling.  Patient was out Friday.  Patient was told to get checked out.  Patient denies any pain today.    On Friday, patient had a slight stiffness in torso.  Used heating pad, icyhot .
